# Patient Record
Sex: Female | Born: 1935 | Smoking: Never smoker
Health system: Southern US, Community
[De-identification: ages and names within clinical notes are randomized; demographics above are authoritative.]

## PROBLEM LIST (undated history)

## (undated) DIAGNOSIS — I7090 Unspecified atherosclerosis: Secondary | ICD-10-CM

## (undated) DIAGNOSIS — E119 Type 2 diabetes mellitus without complications: Secondary | ICD-10-CM

## (undated) DIAGNOSIS — I10 Essential (primary) hypertension: Secondary | ICD-10-CM

## (undated) DIAGNOSIS — N390 Urinary tract infection, site not specified: Secondary | ICD-10-CM

## (undated) DIAGNOSIS — F411 Generalized anxiety disorder: Secondary | ICD-10-CM

## (undated) DIAGNOSIS — I878 Other specified disorders of veins: Secondary | ICD-10-CM

## (undated) DIAGNOSIS — G4700 Insomnia, unspecified: Secondary | ICD-10-CM

## (undated) DIAGNOSIS — M1990 Unspecified osteoarthritis, unspecified site: Secondary | ICD-10-CM

## (undated) DIAGNOSIS — E785 Hyperlipidemia, unspecified: Secondary | ICD-10-CM

## (undated) DIAGNOSIS — R739 Hyperglycemia, unspecified: Secondary | ICD-10-CM

## (undated) HISTORY — DX: Hyperlipidemia, unspecified: E78.5

## (undated) HISTORY — DX: Insomnia, unspecified: G47.00

## (undated) HISTORY — DX: Unspecified atherosclerosis: I70.90

## (undated) HISTORY — DX: Generalized anxiety disorder: F41.1

## (undated) HISTORY — DX: Other specified disorders of veins: I87.8

## (undated) HISTORY — PX: BREAST BIOPSY: SHX20

## (undated) HISTORY — DX: Unspecified osteoarthritis, unspecified site: M19.90

## (undated) HISTORY — DX: Hyperglycemia, unspecified: R73.9

## (undated) HISTORY — DX: Urinary tract infection, site not specified: N39.0

---

## 1977-07-13 HISTORY — PX: ABDOMINAL HYSTERECTOMY: SHX81

## 1982-07-13 HISTORY — PX: CHOLECYSTECTOMY: SHX55

## 2005-07-13 HISTORY — PX: REPLACEMENT TOTAL KNEE: SUR1224

## 2009-10-31 DIAGNOSIS — E785 Hyperlipidemia, unspecified: Secondary | ICD-10-CM | POA: Insufficient documentation

## 2010-03-25 DIAGNOSIS — F411 Generalized anxiety disorder: Secondary | ICD-10-CM | POA: Insufficient documentation

## 2010-05-29 DIAGNOSIS — IMO0002 Reserved for concepts with insufficient information to code with codable children: Secondary | ICD-10-CM | POA: Insufficient documentation

## 2015-07-14 LAB — HM COLONOSCOPY

## 2016-09-15 DIAGNOSIS — Z79899 Other long term (current) drug therapy: Secondary | ICD-10-CM | POA: Diagnosis not present

## 2016-09-15 DIAGNOSIS — E785 Hyperlipidemia, unspecified: Secondary | ICD-10-CM | POA: Diagnosis not present

## 2016-09-17 DIAGNOSIS — E785 Hyperlipidemia, unspecified: Secondary | ICD-10-CM | POA: Diagnosis not present

## 2016-09-17 DIAGNOSIS — I2510 Atherosclerotic heart disease of native coronary artery without angina pectoris: Secondary | ICD-10-CM | POA: Diagnosis not present

## 2016-09-17 DIAGNOSIS — I10 Essential (primary) hypertension: Secondary | ICD-10-CM | POA: Diagnosis not present

## 2016-09-17 DIAGNOSIS — I872 Venous insufficiency (chronic) (peripheral): Secondary | ICD-10-CM | POA: Diagnosis not present

## 2016-09-17 DIAGNOSIS — G4733 Obstructive sleep apnea (adult) (pediatric): Secondary | ICD-10-CM | POA: Diagnosis not present

## 2016-09-17 DIAGNOSIS — Z0000 Encounter for general adult medical examination without abnormal findings: Secondary | ICD-10-CM | POA: Diagnosis not present

## 2016-09-17 DIAGNOSIS — E669 Obesity, unspecified: Secondary | ICD-10-CM | POA: Diagnosis not present

## 2016-10-01 DIAGNOSIS — M5137 Other intervertebral disc degeneration, lumbosacral region: Secondary | ICD-10-CM | POA: Diagnosis not present

## 2016-10-01 DIAGNOSIS — M9903 Segmental and somatic dysfunction of lumbar region: Secondary | ICD-10-CM | POA: Diagnosis not present

## 2016-10-01 DIAGNOSIS — M62838 Other muscle spasm: Secondary | ICD-10-CM | POA: Diagnosis not present

## 2016-10-01 DIAGNOSIS — M9902 Segmental and somatic dysfunction of thoracic region: Secondary | ICD-10-CM | POA: Diagnosis not present

## 2016-10-01 DIAGNOSIS — M9905 Segmental and somatic dysfunction of pelvic region: Secondary | ICD-10-CM | POA: Diagnosis not present

## 2016-10-01 DIAGNOSIS — M545 Low back pain: Secondary | ICD-10-CM | POA: Diagnosis not present

## 2016-10-01 DIAGNOSIS — M62830 Muscle spasm of back: Secondary | ICD-10-CM | POA: Diagnosis not present

## 2016-10-01 DIAGNOSIS — M5136 Other intervertebral disc degeneration, lumbar region: Secondary | ICD-10-CM | POA: Diagnosis not present

## 2016-10-01 DIAGNOSIS — M5416 Radiculopathy, lumbar region: Secondary | ICD-10-CM | POA: Diagnosis not present

## 2016-11-19 DIAGNOSIS — M9902 Segmental and somatic dysfunction of thoracic region: Secondary | ICD-10-CM | POA: Diagnosis not present

## 2016-11-19 DIAGNOSIS — M545 Low back pain: Secondary | ICD-10-CM | POA: Diagnosis not present

## 2016-11-19 DIAGNOSIS — M62830 Muscle spasm of back: Secondary | ICD-10-CM | POA: Diagnosis not present

## 2016-11-19 DIAGNOSIS — M5137 Other intervertebral disc degeneration, lumbosacral region: Secondary | ICD-10-CM | POA: Diagnosis not present

## 2016-11-19 DIAGNOSIS — M5416 Radiculopathy, lumbar region: Secondary | ICD-10-CM | POA: Diagnosis not present

## 2016-11-19 DIAGNOSIS — M62838 Other muscle spasm: Secondary | ICD-10-CM | POA: Diagnosis not present

## 2016-11-19 DIAGNOSIS — M9903 Segmental and somatic dysfunction of lumbar region: Secondary | ICD-10-CM | POA: Diagnosis not present

## 2016-11-19 DIAGNOSIS — M9905 Segmental and somatic dysfunction of pelvic region: Secondary | ICD-10-CM | POA: Diagnosis not present

## 2016-11-19 DIAGNOSIS — M5136 Other intervertebral disc degeneration, lumbar region: Secondary | ICD-10-CM | POA: Diagnosis not present

## 2016-12-24 DIAGNOSIS — M5136 Other intervertebral disc degeneration, lumbar region: Secondary | ICD-10-CM | POA: Diagnosis not present

## 2016-12-24 DIAGNOSIS — M9903 Segmental and somatic dysfunction of lumbar region: Secondary | ICD-10-CM | POA: Diagnosis not present

## 2016-12-24 DIAGNOSIS — M9901 Segmental and somatic dysfunction of cervical region: Secondary | ICD-10-CM | POA: Diagnosis not present

## 2016-12-24 DIAGNOSIS — M542 Cervicalgia: Secondary | ICD-10-CM | POA: Diagnosis not present

## 2016-12-24 DIAGNOSIS — M9902 Segmental and somatic dysfunction of thoracic region: Secondary | ICD-10-CM | POA: Diagnosis not present

## 2016-12-24 DIAGNOSIS — M62830 Muscle spasm of back: Secondary | ICD-10-CM | POA: Diagnosis not present

## 2016-12-24 DIAGNOSIS — M5416 Radiculopathy, lumbar region: Secondary | ICD-10-CM | POA: Diagnosis not present

## 2016-12-24 DIAGNOSIS — M545 Low back pain: Secondary | ICD-10-CM | POA: Diagnosis not present

## 2016-12-24 DIAGNOSIS — M5137 Other intervertebral disc degeneration, lumbosacral region: Secondary | ICD-10-CM | POA: Diagnosis not present

## 2016-12-24 DIAGNOSIS — M9905 Segmental and somatic dysfunction of pelvic region: Secondary | ICD-10-CM | POA: Diagnosis not present

## 2016-12-24 DIAGNOSIS — M62838 Other muscle spasm: Secondary | ICD-10-CM | POA: Diagnosis not present

## 2016-12-31 DIAGNOSIS — H532 Diplopia: Secondary | ICD-10-CM | POA: Diagnosis not present

## 2016-12-31 DIAGNOSIS — H25813 Combined forms of age-related cataract, bilateral: Secondary | ICD-10-CM | POA: Diagnosis not present

## 2016-12-31 DIAGNOSIS — H43393 Other vitreous opacities, bilateral: Secondary | ICD-10-CM | POA: Diagnosis not present

## 2016-12-31 DIAGNOSIS — H527 Unspecified disorder of refraction: Secondary | ICD-10-CM | POA: Diagnosis not present

## 2017-04-01 DIAGNOSIS — Z23 Encounter for immunization: Secondary | ICD-10-CM | POA: Diagnosis not present

## 2017-04-22 DIAGNOSIS — Z1231 Encounter for screening mammogram for malignant neoplasm of breast: Secondary | ICD-10-CM | POA: Diagnosis not present

## 2017-05-04 DIAGNOSIS — L570 Actinic keratosis: Secondary | ICD-10-CM | POA: Diagnosis not present

## 2017-05-04 DIAGNOSIS — I10 Essential (primary) hypertension: Secondary | ICD-10-CM | POA: Diagnosis not present

## 2017-05-04 DIAGNOSIS — L820 Inflamed seborrheic keratosis: Secondary | ICD-10-CM | POA: Diagnosis not present

## 2017-05-04 DIAGNOSIS — D1801 Hemangioma of skin and subcutaneous tissue: Secondary | ICD-10-CM | POA: Diagnosis not present

## 2017-05-04 DIAGNOSIS — I2510 Atherosclerotic heart disease of native coronary artery without angina pectoris: Secondary | ICD-10-CM | POA: Diagnosis not present

## 2017-05-04 DIAGNOSIS — L853 Xerosis cutis: Secondary | ICD-10-CM | POA: Diagnosis not present

## 2017-05-04 DIAGNOSIS — L821 Other seborrheic keratosis: Secondary | ICD-10-CM | POA: Diagnosis not present

## 2017-09-22 DIAGNOSIS — M8580 Other specified disorders of bone density and structure, unspecified site: Secondary | ICD-10-CM | POA: Diagnosis not present

## 2017-09-22 DIAGNOSIS — E785 Hyperlipidemia, unspecified: Secondary | ICD-10-CM | POA: Diagnosis not present

## 2017-09-28 DIAGNOSIS — E785 Hyperlipidemia, unspecified: Secondary | ICD-10-CM | POA: Diagnosis not present

## 2017-09-28 DIAGNOSIS — R7301 Impaired fasting glucose: Secondary | ICD-10-CM | POA: Diagnosis not present

## 2017-09-28 DIAGNOSIS — Z0000 Encounter for general adult medical examination without abnormal findings: Secondary | ICD-10-CM | POA: Diagnosis not present

## 2017-09-28 DIAGNOSIS — Z780 Asymptomatic menopausal state: Secondary | ICD-10-CM | POA: Diagnosis not present

## 2017-09-28 DIAGNOSIS — R399 Unspecified symptoms and signs involving the genitourinary system: Secondary | ICD-10-CM | POA: Diagnosis not present

## 2017-09-28 DIAGNOSIS — N3001 Acute cystitis with hematuria: Secondary | ICD-10-CM | POA: Diagnosis not present

## 2017-09-28 DIAGNOSIS — F411 Generalized anxiety disorder: Secondary | ICD-10-CM | POA: Diagnosis not present

## 2017-09-28 DIAGNOSIS — M8580 Other specified disorders of bone density and structure, unspecified site: Secondary | ICD-10-CM | POA: Diagnosis not present

## 2017-09-28 DIAGNOSIS — F5104 Psychophysiologic insomnia: Secondary | ICD-10-CM | POA: Diagnosis not present

## 2017-09-28 DIAGNOSIS — Z23 Encounter for immunization: Secondary | ICD-10-CM | POA: Diagnosis not present

## 2017-09-28 DIAGNOSIS — I2510 Atherosclerotic heart disease of native coronary artery without angina pectoris: Secondary | ICD-10-CM | POA: Diagnosis not present

## 2017-09-28 DIAGNOSIS — I872 Venous insufficiency (chronic) (peripheral): Secondary | ICD-10-CM | POA: Diagnosis not present

## 2017-10-12 DIAGNOSIS — Z780 Asymptomatic menopausal state: Secondary | ICD-10-CM | POA: Diagnosis not present

## 2017-10-12 DIAGNOSIS — M8580 Other specified disorders of bone density and structure, unspecified site: Secondary | ICD-10-CM | POA: Diagnosis not present

## 2017-10-12 DIAGNOSIS — M85852 Other specified disorders of bone density and structure, left thigh: Secondary | ICD-10-CM | POA: Diagnosis not present

## 2017-10-12 DIAGNOSIS — M8588 Other specified disorders of bone density and structure, other site: Secondary | ICD-10-CM | POA: Diagnosis not present

## 2017-10-19 DIAGNOSIS — H838X3 Other specified diseases of inner ear, bilateral: Secondary | ICD-10-CM | POA: Diagnosis not present

## 2017-10-19 DIAGNOSIS — H903 Sensorineural hearing loss, bilateral: Secondary | ICD-10-CM | POA: Diagnosis not present

## 2017-12-20 ENCOUNTER — Encounter: Payer: Self-pay | Admitting: Primary Care

## 2017-12-20 ENCOUNTER — Ambulatory Visit (INDEPENDENT_AMBULATORY_CARE_PROVIDER_SITE_OTHER): Payer: Medicare Other | Admitting: Primary Care

## 2017-12-20 VITALS — BP 124/84 | HR 72 | Temp 98.0°F | Ht <= 58 in | Wt 159.5 lb

## 2017-12-20 DIAGNOSIS — M150 Primary generalized (osteo)arthritis: Secondary | ICD-10-CM

## 2017-12-20 DIAGNOSIS — E119 Type 2 diabetes mellitus without complications: Secondary | ICD-10-CM | POA: Insufficient documentation

## 2017-12-20 DIAGNOSIS — R739 Hyperglycemia, unspecified: Secondary | ICD-10-CM

## 2017-12-20 DIAGNOSIS — R7303 Prediabetes: Secondary | ICD-10-CM | POA: Diagnosis not present

## 2017-12-20 DIAGNOSIS — R102 Pelvic and perineal pain: Secondary | ICD-10-CM

## 2017-12-20 DIAGNOSIS — M8949 Other hypertrophic osteoarthropathy, multiple sites: Secondary | ICD-10-CM | POA: Insufficient documentation

## 2017-12-20 DIAGNOSIS — G4700 Insomnia, unspecified: Secondary | ICD-10-CM | POA: Insufficient documentation

## 2017-12-20 DIAGNOSIS — M159 Polyosteoarthritis, unspecified: Secondary | ICD-10-CM | POA: Insufficient documentation

## 2017-12-20 LAB — POC URINALSYSI DIPSTICK (AUTOMATED)
Bilirubin, UA: NEGATIVE
Blood, UA: NEGATIVE
Glucose, UA: NEGATIVE
Ketones, UA: NEGATIVE
Leukocytes, UA: NEGATIVE
Nitrite, UA: NEGATIVE
Protein, UA: NEGATIVE
Spec Grav, UA: >=1.03 — AB (ref 1.010–1.025)
Urobilinogen, UA: 0.2 E.U./dL
pH, UA: 5.5 (ref 5.0–8.0)

## 2017-12-20 LAB — POCT GLYCOSYLATED HEMOGLOBIN (HGB A1C): Hemoglobin A1C: 5.7 % — AB (ref 4.0–5.6)

## 2017-12-20 MED ORDER — ZOLPIDEM TARTRATE 5 MG PO TABS: 5.0000 mg | ORAL_TABLET | Freq: Every evening | ORAL | 0 refills | Status: DC | PRN

## 2017-12-20 MED ORDER — TRAMADOL HCL 50 MG PO TABS: ORAL_TABLET | ORAL | 0 refills | Status: DC

## 2017-12-20 NOTE — Assessment & Plan Note (Signed)
Managed on Tramadol 50 mg once to twice daily for which she's taken for years. Discussed to use sparingly. Refill sent to pharmacy.

## 2017-12-20 NOTE — Assessment & Plan Note (Signed)
Managed on Ambien 10 mg for which she's taken for years. Will send Ambien 5 mg tablet as she's already taking 1/2 tablet. Refill sent to pharmacy.

## 2017-12-20 NOTE — Progress Notes (Signed)
Subjective:    Patient ID: Alyssa Roy, female    DOB: February 15, 1933, 84 y.o.   MRN: [id number]  HPI  [name] is a 84 year old female who presents today to establish care and discuss the problems mentioned below. Will obtain old records.  1) Osteoarthritis: Located to the lower back, hips, groin, knees. Currently managed on Tramadol 50 mg for which she's been taking for several years. Most of the time she'll take at least one tablet daily, sometimes twice daily. She doesn't take anything OTC for her symptoms. 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   2) Insomnia: Currently managed on Ambien 10 mg for which she cuts in half and takes 3-4 times weekly on average. She's tried OTC sleep without improvement. She's been taking this for years and has done well.  It appears she was initiated on Lexapro 5 mg during her last PCP visit in March 2019. She didn't take this for long as it was "temporary".   3) Decrease in Bone Density: Underwent bone density testing recently and was told that she has a decrease in bone density but is unsure if she has osteoporosis or osteopenia. It was recommended she start Fosamax but she declined. She does not take calcium and vitamin D.  4) Suprapubic Pressure: History of UTI that was last diagnosed around March/April 2019. She was treated with Augmentin for which she could not tolerate, then Cipro. She doesn't ever believe her symptoms resolved. She denies hematuria, frequency, dysuria, fevers, nausea.   Review of Systems  Constitutional: Negative for unexpected weight change.  Eyes: Negative for visual disturbance.  Respiratory: Negative for shortness of breath.   Cardiovascular: Negative for chest pain.  Genitourinary: Negative for dysuria, flank pain, frequency, hematuria and vaginal discharge.       Suprapubic pressure.  Musculoskeletal: Positive for arthralgias.  Skin: Negative for color change.  Neurological:  Negative for headaches.  Hematological: Negative for adenopathy.  Psychiatric/Behavioral:       Chronic insomnia       Past Medical History:  Diagnosis Date  . Atherosclerosis   . Chronic venous stasis   . GAD (generalized anxiety disorder)   . Hyperglycemia   . Hyperlipidemia   . Insomnia   . Osteoarthritis   . Urinary tract infection      Social History   Socioeconomic History  . Marital status: Unknown    Spouse name: Not on file  . Number of children: Not on file  . Years of education: Not on file  . Highest education level: Not on file  Occupational History  . Not on file  Social Needs  . Financial resource strain: Not on file  . Food insecurity:    Worry: Not on file    Inability: Not on file  . Transportation needs:    Medical: Not on file    Non-medical: Not on file  Tobacco Use  . Smoking status: Never Smoker  . Smokeless tobacco: Never Used  Substance and Sexual Activity  . Alcohol use: Never    Frequency: Never  . Drug use: Not on file  . Sexual activity: Not on file  Lifestyle  . Physical activity:    Days per week: Not on file    Minutes per session: Not on file  . Stress: Not on file  Relationships  . Social connections:    Talks on phone: Not on file    Gets together: Not on file  Attends religious service: Not on file    Active member of club or organization: Not on file    Attends meetings of clubs or organizations: Not on file    Relationship status: Not on file  . Intimate partner violence:    Fear of current or ex partner: Not on file    Emotionally abused: Not on file    Physically abused: Not on file    Forced sexual activity: Not on file  Other Topics Concern  . Not on file  Social History Narrative  . Not on file      Family History  Problem Relation Age of Onset  . Heart disease Mother   . Arthritis Mother   . Diabetes Father     Allergies  Allergen Reactions  . Crestor [Rosuvastatin Calcium]   . Feldene  [Piroxicam]   . Keflex [Cephalexin]   . Lipitor [Atorvastatin Calcium]   . Zetia [Ezetimibe]     No current outpatient medications on file prior to visit.   No current facility-administered medications on file prior to visit.     BP 124/84   Pulse 72   Temp 98 F (36.7 C) (Oral)   Ht 4' 7.25" (1.403 m)   Wt 159 lb 8 oz (72.3 kg)   SpO2 98%   BMI 36.74 kg/m    Objective:   Physical Exam  Constitutional: She is oriented to person, place, and time. She appears well-nourished.  Neck: Neck supple.  Cardiovascular: Normal rate and regular rhythm.  Respiratory: Effort normal and breath sounds normal.  Musculoskeletal:  Decrease in ROM to lumbar spine  Neurological: She is alert and oriented to person, place, and time.  Skin: Skin is warm and dry.  Psychiatric: She has a normal mood and affect.           Assessment & Plan:  Suprapubic Pressure:  History of UTI several months ago.  UA today negative.  Could be interstitial cystitis. She will report if symptoms persist. Didn't have much time today to do further work up.  Pleas Koch, NP

## 2017-12-20 NOTE — Patient Instructions (Signed)
Use the Tramadol sparingly as discussed, I sent a refill to your pharmacy.  I sent Ambien 5 mg tablets to your pharmacy.  Stop by the lab prior to leaving today. I will notify you of your results once received.   Start taking Calcium and Vitamin D. You need 1200 mg of calcium and 800 units of vitamin D daily.  It was a pleasure to meet you today! Please don't hesitate to call or message me with any questions. Welcome to [facility]!

## 2017-12-20 NOTE — Assessment & Plan Note (Signed)
Noted on recent labs, POC A1C of 5.7 today.  Will continue to monitor.

## 2018-01-26 DIAGNOSIS — H25813 Combined forms of age-related cataract, bilateral: Secondary | ICD-10-CM | POA: Diagnosis not present

## 2018-01-26 DIAGNOSIS — H532 Diplopia: Secondary | ICD-10-CM | POA: Diagnosis not present

## 2018-01-26 DIAGNOSIS — H43393 Other vitreous opacities, bilateral: Secondary | ICD-10-CM | POA: Diagnosis not present

## 2018-01-26 DIAGNOSIS — H524 Presbyopia: Secondary | ICD-10-CM | POA: Diagnosis not present

## 2018-04-12 DIAGNOSIS — Z23 Encounter for immunization: Secondary | ICD-10-CM | POA: Diagnosis not present

## 2018-04-21 ENCOUNTER — Other Ambulatory Visit: Payer: Self-pay | Admitting: Primary Care

## 2018-04-21 DIAGNOSIS — M150 Primary generalized (osteo)arthritis: Principal | ICD-10-CM

## 2018-04-21 DIAGNOSIS — M8949 Other hypertrophic osteoarthropathy, multiple sites: Secondary | ICD-10-CM

## 2018-04-21 DIAGNOSIS — M159 Polyosteoarthritis, unspecified: Secondary | ICD-10-CM

## 2018-04-21 NOTE — Telephone Encounter (Signed)
Name of Medication: traMADol (ULTRAM) 50 MG tablet  Name of Pharmacy: [facility]  Last Fill or Written Date and Quantity: 12/21/2014  #60  Last Office Visit and Type: 12/20/2017 [facility]  Next Office Visit and Type:   Last Controlled Substance Agreement Date:   Last UDS:

## 2018-04-21 NOTE — Telephone Encounter (Signed)
Noted, refill sent to pharmacy. No suspicious activity noted on PMP aware site.  

## 2018-04-23 ENCOUNTER — Emergency Department: Payer: Medicare Other

## 2018-04-23 ENCOUNTER — Emergency Department
Admission: EM | Admit: 2018-04-23 | Discharge: 2018-04-23 | Disposition: A | Discharge status: Home / Self Care | Payer: Medicare Other | Attending: Emergency Medicine | Admitting: Emergency Medicine

## 2018-04-23 ENCOUNTER — Other Ambulatory Visit: Payer: Self-pay

## 2018-04-23 ENCOUNTER — Encounter: Payer: Self-pay | Admitting: Emergency Medicine

## 2018-04-23 DIAGNOSIS — R7303 Prediabetes: Secondary | ICD-10-CM | POA: Insufficient documentation

## 2018-04-23 DIAGNOSIS — M542 Cervicalgia: Secondary | ICD-10-CM | POA: Diagnosis not present

## 2018-04-23 DIAGNOSIS — I10 Essential (primary) hypertension: Secondary | ICD-10-CM | POA: Insufficient documentation

## 2018-04-23 DIAGNOSIS — Z96651 Presence of right artificial knee joint: Secondary | ICD-10-CM | POA: Insufficient documentation

## 2018-04-23 DIAGNOSIS — M19032 Primary osteoarthritis, left wrist: Secondary | ICD-10-CM | POA: Diagnosis not present

## 2018-04-23 DIAGNOSIS — M25532 Pain in left wrist: Secondary | ICD-10-CM | POA: Diagnosis present

## 2018-04-23 DIAGNOSIS — L03114 Cellulitis of left upper limb: Secondary | ICD-10-CM | POA: Diagnosis not present

## 2018-04-23 DIAGNOSIS — M7989 Other specified soft tissue disorders: Secondary | ICD-10-CM | POA: Diagnosis not present

## 2018-04-23 LAB — BASIC METABOLIC PANEL
Anion gap: 10 (ref 5–15)
BUN: 14 mg/dL (ref 8–23)
CO2: 25 mmol/L (ref 22–32)
Calcium: 9.2 mg/dL (ref 8.9–10.3)
Chloride: 105 mmol/L (ref 98–111)
Creatinine, Ser: 0.73 mg/dL (ref 0.44–1.00)
GFR calc Af Amer: >60 mL/min (ref >60)
GFR calc non Af Amer: >60 mL/min (ref >60)
Glucose, Bld: 145 mg/dL — ABNORMAL HIGH (ref 70–99)
Potassium: 3.2 mmol/L — ABNORMAL LOW (ref 3.5–5.1)
Sodium: 140 mmol/L (ref 135–145)

## 2018-04-23 LAB — CBC WITH DIFFERENTIAL/PLATELET
Abs Immature Granulocytes: 0.07 10*3/uL (ref 0.00–0.07)
Basophils Absolute: 0 10*3/uL (ref 0.0–0.1)
Basophils Relative: 0 %
Eosinophils Absolute: 0 10*3/uL (ref 0.0–0.5)
Eosinophils Relative: 0 %
HCT: 44.1 % (ref 36.0–46.0)
Hemoglobin: 14.5 g/dL (ref 12.0–15.0)
Immature Granulocytes: 1 %
Lymphocytes Relative: 8 %
Lymphs Abs: 1.1 10*3/uL (ref 0.7–4.0)
MCH: 27.6 pg (ref 26.0–34.0)
MCHC: 32.9 g/dL (ref 30.0–36.0)
MCV: 83.8 fL (ref 80.0–100.0)
Monocytes Absolute: 0.9 10*3/uL (ref 0.1–1.0)
Monocytes Relative: 7 %
Neutro Abs: 11.1 10*3/uL — ABNORMAL HIGH (ref 1.7–7.7)
Neutrophils Relative %: 84 %
Platelets: 295 10*3/uL (ref 150–400)
RBC: 5.26 MIL/uL — ABNORMAL HIGH (ref 3.87–5.11)
RDW: 14.2 % (ref 11.5–15.5)
WBC: 13.2 10*3/uL — ABNORMAL HIGH (ref 4.0–10.5)
nRBC: 0 % (ref 0.0–0.2)

## 2018-04-23 LAB — URINALYSIS, COMPLETE (UACMP) WITH MICROSCOPIC
Bacteria, UA: NONE SEEN
Bilirubin Urine: NEGATIVE
Glucose, UA: NEGATIVE mg/dL
Ketones, ur: 20 mg/dL — AB
Leukocytes, UA: NEGATIVE
Nitrite: NEGATIVE
Protein, ur: 100 mg/dL — AB
Specific Gravity, Urine: 1.024 (ref 1.005–1.030)
pH: 5 (ref 5.0–8.0)

## 2018-04-23 LAB — URIC ACID: Uric Acid, Serum: 4.3 mg/dL (ref 2.5–7.1)

## 2018-04-23 MED ORDER — KETOROLAC TROMETHAMINE 30 MG/ML IJ SOLN
30.0000 mg | Freq: Once | INTRAMUSCULAR | Status: AC
  Administered 2018-04-23: 30 mg via INTRAVENOUS
  Filled 2018-04-23: qty 1

## 2018-04-23 MED ORDER — LORAZEPAM 2 MG/ML IJ SOLN
1.0000 mg | Freq: Once | INTRAMUSCULAR | Status: AC
  Administered 2018-04-23: 1 mg via INTRAVENOUS
  Filled 2018-04-23: qty 1

## 2018-04-23 MED ORDER — KEFLEX 500 MG PO CAPS: 500.0000 mg | ORAL_CAPSULE | Freq: Three times a day (TID) | ORAL | 0 refills | Status: DC

## 2018-04-23 MED ORDER — CYCLOBENZAPRINE HCL 5 MG PO TABS: 5.0000 mg | ORAL_TABLET | Freq: Three times a day (TID) | ORAL | 0 refills | Status: AC | PRN

## 2018-04-23 MED ORDER — CEFAZOLIN SODIUM-DEXTROSE 1-4 GM/50ML-% IV SOLN
1.0000 g | Freq: Once | INTRAVENOUS | Status: AC
  Administered 2018-04-23: 1 g via INTRAVENOUS
  Filled 2018-04-23: qty 50

## 2018-04-23 NOTE — ED Triage Notes (Signed)
Pt to ED from home c/o left wrist pain x2 days.  States noticed swelling and redness that has gotten worse that is a throbbing pain.  Denies injury or falls.  Also c/o left neck pain worse with turning head.  Denies chest pain or SOB, pt A&Ox4, chest rise even and unlabored.  States has not taken BP medication for a couple years.

## 2018-04-23 NOTE — Discharge Instructions (Addendum)
You have been treated for a (non-purulent) cellulitis. Take the antibiotic as directed. Take the muscle relaxant as needed. You may dose these medicines along with your "as needed" Tramadol and Tylenol. Follow-up with your provider for wound check and blood pressure check, next week. Return to the ED as needed.

## 2018-04-23 NOTE — ED Provider Notes (Signed)
[facility] Emergency Department Provider Note ____________________________________________  Time seen: 1603  I have reviewed the triage vital signs and the nursing notes.  HISTORY  Chief Complaint  Wrist Pain  HPI Alyssa Roy is a 84 y.o. female presented to the ED accompanied by her son and daughter-in-law, for evaluation of pain, redness, swelling to the left wrist.  Patient with a history of atherosclerosis, chronic venous stasis, and osteoarthritis, presents with pain to the left wrist dorsally without known injury or trauma.  She does recall there was a small blisterlike formation to the thenar prominence of the palm a few days prior to onset.  She describes the area was itchy and she does admit to scratching it.  Since that time she has had some redness, swelling to the dorsal hand and wrist.  She was concerned because she began to see sprain of the redness up towards the elbow.  She denies any outright fevers, chills, sweats, chest pain, or shortness of breath.  She is been taken Tylenol intermittently as she does daily for arthritis pain relief.  She took her tramadol as directed every 8 hours for pain, but reports that she did not have any benefit from the tramadol of each of the wrist pain.  Nuys any history of gouty arthritis, cellulitis, or eczema.  Past Medical History:  Diagnosis Date  . Atherosclerosis   . Chronic venous stasis   . GAD (generalized anxiety disorder)   . Hyperglycemia   . Hyperlipidemia   . Insomnia   . Osteoarthritis   . Urinary tract infection     Patient Active Problem List   Diagnosis Date Noted  . Insomnia 12/20/2017  . Primary osteoarthritis involving multiple joints 12/20/2017  . Prediabetes 12/20/2017    Past Surgical History:  Procedure Laterality Date  . ABDOMINAL HYSTERECTOMY  1979  . CHOLECYSTECTOMY  1984  . REPLACEMENT TOTAL KNEE Right 2007    Prior to Admission medications   Medication Sig Start Date  End Date Taking? Authorizing Provider  cyclobenzaprine (FLEXERIL) 5 MG tablet Take 1 tablet (5 mg total) by mouth 3 (three) times daily as needed for up to 3 days for muscle spasms. 04/23/18 04/26/18  Camay Pedigo, Dannielle Karvonen, PA-C  KEFLEX 500 MG capsule Take 1 capsule (500 mg total) by mouth 3 (three) times daily for 10 days. 04/23/18 05/03/18  Artemisia Auvil, Dannielle Karvonen, PA-C  traMADol (ULTRAM) 50 MG tablet TAKE 1 TABLET BY MOUTH EVERY 8 HOURS AS NEEDED FOR PAIN. USE SPARINGLY. 04/21/18   Pleas Koch, NP  zolpidem (AMBIEN) 5 MG tablet Take 1 tablet (5 mg total) by mouth at bedtime as needed for sleep. 12/20/17   Pleas Koch, NP    Allergies Crestor [rosuvastatin calcium]; Feldene [piroxicam]; Keflex [cephalexin]; Lipitor [atorvastatin calcium]; and Zetia [ezetimibe]  Family History  Problem Relation Age of Onset  . Heart disease Mother   . Arthritis Mother   . Diabetes Father     Social History Social History   Tobacco Use  . Smoking status: Never Smoker  . Smokeless tobacco: Never Used  Substance Use Topics  . Alcohol use: Never    Frequency: Never  . Drug use: Not on file    Review of Systems  Constitutional: Negative for fever. Eyes: Negative for visual changes. ENT: Negative for sore throat. Cardiovascular: Negative for chest pain. Respiratory: Negative for shortness of breath. Gastrointestinal: Negative for abdominal pain, vomiting and diarrhea. Genitourinary: Negative for dysuria. Musculoskeletal: Positive for left neck  pain. Left wrist pain and swelling as noted.  Skin: Negative for rash. Left wrist redness as above Neurological: Negative for headaches, focal weakness or numbness. ____________________________________________  PHYSICAL EXAM:  VITAL SIGNS: ED Triage Vitals  Enc Vitals Group     BP 04/23/18 1204 (!) 180/114     Pulse Rate 04/23/18 1204 99     Resp 04/23/18 1204 12     Temp 04/23/18 1204 98.3 F (36.8 C)     Temp Source 04/23/18 1204  Oral     SpO2 04/23/18 1204 96 %     Weight 04/23/18 1211 165 lb (74.8 kg)     Height 04/23/18 1211 4\' 7"  (1.397 m)     Head Circumference --      Peak Flow --      Pain Score 04/23/18 1210 9     Pain Loc --      Pain Edu? --      Excl. in [place]? --     Constitutional: Alert and oriented. Well appearing and in no distress. Head: Normocephalic and atraumatic. Eyes: Conjunctivae are normal. Normal extraocular movements Cardiovascular: Normal rate, regular rhythm. Normal distal pulses. Respiratory: Normal respiratory effort. No wheezes/rales/rhonchi. Musculoskeletal: Left hand and wrist with obvious soft tissue swelling and subtle erythema noted dorsally.  She also appears to be some lymphangitis up the dorsal aspect of the forearm.  Patient is exquisitely tender to palpation over the dorsal wrist.  She has decreased range of motion with wrist extension, composite fist, and supination range.  The elbow is without tenderness to palpation in the shoulder ranges normally.  Also has some palpable tenderness over the left upper trapezius musculature.  She otherwise is with some decreased left rotation and left lateral bending to the neck.  Nontender with normal range of motion in all other extremities.  Neurologic:  Normal sensation. Normal speech and language. No gross focal neurologic deficits are appreciated. Skin:  Skin is warm, dry and intact. No rash noted. Psychiatric: Mood and affect are normal. Patient exhibits appropriate insight and judgment. ____________________________________________   LABS (pertinent positives/negatives)  Labs Reviewed  CBC WITH DIFFERENTIAL/PLATELET - Abnormal; Notable for the following components:      Result Value   WBC 13.2 (*)    RBC 5.26 (*)    Neutro Abs 11.1 (*)    All other components within normal limits  BASIC METABOLIC PANEL - Abnormal; Notable for the following components:   Potassium 3.2 (*)    Glucose, Bld 145 (*)    All other components within  normal limits  URINALYSIS, COMPLETE (UACMP) WITH MICROSCOPIC - Abnormal; Notable for the following components:   Color, Urine YELLOW (*)    APPearance CLEAR (*)    Hgb urine dipstick MODERATE (*)    Ketones, ur 20 (*)    Protein, ur 100 (*)    All other components within normal limits  URIC ACID  ____________________________________________   RADIOLOGY  Left Wrist IMPRESSION: Soft tissue swelling.  Negative for fracture ____________________________________________  PROCEDURES  Procedures Toradol 30 mg IVP Lorazepam 1 mg IVP Cefazolin 50 mg IVPB ____________________________________________  INITIAL IMPRESSION / ASSESSMENT AND PLAN / ED COURSE  Geriatric patient with ED evaluation of sudden left wrist pain, swelling, and redness; without recent trauma. The DDX includes cellulitis, gout, septic joint, and fracture. She is found to have a negative x-ray and a normal uric acid.  She has an elevated white count at 13+ and a left shift noted.  Patient was treated  empirically for a nonpurulent cellulitis of the left forearm.  IV antibiotics are provided, and the time of discharge patient has improved swelling, and redness of the dorsal wrist.  Skin marker is used to locate the current edges of the erythema.  Patient will monitor treatment and will be discharged with a prescription for Keflex to be dosed as directed.  She is also given a small prescription of cyclobenzaprine for muscle pain relief.  She may use her over-the-counter tramadol as needed for additional pain relief.  She is encouraged to follow with primary provider in 1 week for wound check as necessary.  Return precautions have been reviewed.  Patient, her adult son, and his wife, all verbalized understanding of instructions and questions were encouraged and answered. ____________________________________________  FINAL CLINICAL IMPRESSION(S) / ED DIAGNOSES  Final diagnoses:  Cellulitis of left upper extremity  Uncontrolled  hypertension      Trevel Dillenbeck, Dannielle Karvonen, PA-C 04/24/18 0040    Schuyler Amor, MD 04/24/18 2113

## 2018-04-23 NOTE — ED Notes (Signed)
Pt has redness pain and swelling to l wrist distal forearm  Pt denies injury  bp is   elevated pt has a history of htn in past not on any bp meds at this time   She is awake and alert oriented -family  At bedside

## 2018-04-25 ENCOUNTER — Telehealth: Payer: Self-pay | Admitting: Primary Care

## 2018-04-25 NOTE — Telephone Encounter (Signed)
Spoken and notified patient's daughter in law of Tawni Millers comments. Verbalized understanding

## 2018-04-25 NOTE — Telephone Encounter (Signed)
Yes, Ice three times daily with a towel for 20 minute intervals. Also anti-inflammatories like ibuprofen, naproxen, Advil, Motrin, Aleve.

## 2018-04-25 NOTE — Telephone Encounter (Signed)
Alyssa Roy aware of appointment

## 2018-04-25 NOTE — Telephone Encounter (Signed)
Hand is still swollen should they be putting ice or heat pad on it

## 2018-04-25 NOTE — Telephone Encounter (Signed)
I can add her on at 4 pm on Tuesday October 15th, please schedule. Thanks!

## 2018-04-25 NOTE — Telephone Encounter (Signed)
See below CRM  Copied from [place] [phone]. Topic: Appointment Scheduling - Scheduling Inquiry for Clinic >> Apr 25, 2018  7:33 AM Scherrie Gerlach wrote: Reason for CRM: pt went to the ED this weekend. Dx with cellulitis.  Daughter in law calling to get a hosp follow up appt asap, because she states pt is not better and in pain. Pt also instructed to see her dr for bp issues. Anda Kraft is completely booked this week, and she wants to be seen asap.  Please advise >> Apr 25, 2018  8:25 AM Modena Nunnery, CMA wrote: Pt is not TCM eligible

## 2018-04-26 ENCOUNTER — Ambulatory Visit (INDEPENDENT_AMBULATORY_CARE_PROVIDER_SITE_OTHER): Payer: Medicare Other | Admitting: Primary Care

## 2018-04-26 VITALS — BP 134/74 | HR 71 | Temp 98.0°F | Ht <= 58 in | Wt 169.5 lb

## 2018-04-26 DIAGNOSIS — M150 Primary generalized (osteo)arthritis: Secondary | ICD-10-CM | POA: Diagnosis not present

## 2018-04-26 DIAGNOSIS — M8949 Other hypertrophic osteoarthropathy, multiple sites: Secondary | ICD-10-CM

## 2018-04-26 DIAGNOSIS — L03114 Cellulitis of left upper limb: Secondary | ICD-10-CM | POA: Diagnosis not present

## 2018-04-26 DIAGNOSIS — M159 Polyosteoarthritis, unspecified: Secondary | ICD-10-CM

## 2018-04-26 MED ORDER — DICLOFENAC SODIUM 75 MG PO TBEC: 75.0000 mg | DELAYED_RELEASE_TABLET | Freq: Two times a day (BID) | ORAL | 0 refills | Status: DC | PRN

## 2018-04-26 NOTE — Assessment & Plan Note (Signed)
Increased to neck and hips recently.   Will have her start with scheduled Tylenol Arthritis at 1300 mg BID or 650 mg q 6-8 hours. Rx for diclofenac EC tablets sent to pharmacy to take as needed for breakthrough pain. Discussed to avoid Tramadol if no improvement.   She will update.

## 2018-04-26 NOTE — Patient Instructions (Signed)
Start taking your Tylenol Arthritis daily. Take two 650 mg tablets twice daily or one 650 mg tablet every 6 hours. Do this for at least 2 weeks.   You may take the diclofenac medication for inflammation and pain twice daily as needed.  Elevate your arm as discussed to reduce swelling. Ice your hand three times daily for 20 minutes at time.   Schedule a follow up visit for Monday next week. It was a pleasure to see you today!

## 2018-04-26 NOTE — Progress Notes (Signed)
Subjective:    Patient ID: Alyssa Roy, female    DOB: 14-May-1933, 84 y.o.   MRN: [id number]  HPI  Alyssa Roy is an 84 year old female who presents today for emergency department follow up.  She presented to [facility] ED on 04/23/18 with reports of left dorsal wrist pain with swelling and redness. She noticed a blister to the hand several days prior, did scratch the site.   During her stay in the ED she underwent work up including uric acid (negative), CBC (WBC count of 13 with left shift), xray of the hand which was unremarkable. Given presentation with leukocytosis, she was presumed to have non purulent cellulitis and was treated with IV Cefazolin 50 mg. Her swelling and redness improved and she was discharged home with a prescription for cephalexin 500 mg TID x 10 days and for PCP follow up.   Since her discharge home she has noticed improvement in swelling, which has nearly resolved to her forearm, and is improving to her left wrist and dorsal hand. She is wrapping her arm and wrist with an ACE bandage.  She denies fevers, chills.   BP Readings from Last 3 Encounters:  04/26/18 134/74  04/23/18 (!) 148/87  12/20/17 124/84   She has a history of chronic neck pain, also with chronic joint aches to knees, back hips, feet. She's noticed increased pain to her neck as she's been more sedentary. She was provided with a prescription for cyclobenzaprine during her recent ED visit, this hasn't helped with pain. She was seeing a chiropractor every several months for neck adjustments, this helped with her pain but she hasn't been back in 6 months.   Most days she's taking Tramadol 50 mg sparingly as needed, sometimes 1200 mg of Tylenol. She's not taking anything regularly for arthritis pain.    Review of Systems  Constitutional: Negative for fever.  Musculoskeletal: Positive for arthralgias, back pain and neck pain.  Skin: Positive for color change.       Past Medical History:  Diagnosis  Date  . Atherosclerosis   . Chronic venous stasis   . GAD (generalized anxiety disorder)   . Hyperglycemia   . Hyperlipidemia   . Insomnia   . Osteoarthritis   . Urinary tract infection      Social History   Socioeconomic History  . Marital status: Unknown    Spouse name: Not on file  . Number of children: Not on file  . Years of education: Not on file  . Highest education level: Not on file  Occupational History  . Not on file  Social Needs  . Financial resource strain: Not on file  . Food insecurity:    Worry: Not on file    Inability: Not on file  . Transportation needs:    Medical: Not on file    Non-medical: Not on file  Tobacco Use  . Smoking status: Never Smoker  . Smokeless tobacco: Never Used  Substance and Sexual Activity  . Alcohol use: Never    Frequency: Never  . Drug use: Not on file  . Sexual activity: Not on file  Lifestyle  . Physical activity:    Days per week: Not on file    Minutes per session: Not on file  . Stress: Not on file  Relationships  . Social connections:    Talks on phone: Not on file    Gets together: Not on file    Attends religious service: Not on file  Active member of club or organization: Not on file    Attends meetings of clubs or organizations: Not on file    Relationship status: Not on file  . Intimate partner violence:    Fear of current or ex partner: Not on file    Emotionally abused: Not on file    Physically abused: Not on file    Forced sexual activity: Not on file  Other Topics Concern  . Not on file  Social History Narrative   Widow.   Retired.   Moved to [place] to live closer to daughter.    Past Surgical History:  Procedure Laterality Date  . ABDOMINAL HYSTERECTOMY  1979  . CHOLECYSTECTOMY  1984  . REPLACEMENT TOTAL KNEE Right 2007    Family History  Problem Relation Age of Onset  . Heart disease Mother   . Arthritis Mother   . Diabetes Father     Allergies  Allergen Reactions  .  Crestor [Rosuvastatin Calcium]   . Feldene [Piroxicam]   . Keflex [Cephalexin]   . Lipitor [Atorvastatin Calcium]   . Zetia [Ezetimibe]     Current Outpatient Medications on File Prior to Visit  Medication Sig Dispense Refill  . cyclobenzaprine (FLEXERIL) 5 MG tablet Take 1 tablet (5 mg total) by mouth 3 (three) times daily as needed for up to 3 days for muscle spasms. 9 tablet 0  . KEFLEX 500 MG capsule Take 1 capsule (500 mg total) by mouth 3 (three) times daily for 10 days. 30 capsule 0  . traMADol (ULTRAM) 50 MG tablet TAKE 1 TABLET BY MOUTH EVERY 8 HOURS AS NEEDED FOR PAIN. USE SPARINGLY. 60 tablet 0  . zolpidem (AMBIEN) 5 MG tablet Take 1 tablet (5 mg total) by mouth at bedtime as needed for sleep. 90 tablet 0   No current facility-administered medications on file prior to visit.     BP 134/74   Pulse 71   Temp 98 F (36.7 C) (Oral)   Ht 4' 7.25" (1.403 m)   Wt 169 lb 8 oz (76.9 kg)   SpO2 95%   BMI 39.04 kg/m    Objective:   Physical Exam  Constitutional: She appears well-nourished.  Neck: Decreased range of motion present.  Decrease in ROM due to pain with flexion, extension, lateral rotation.   Cardiovascular: Normal rate and regular rhythm.  Respiratory: Effort normal.  Skin: Skin is warm and dry. There is erythema.  Moderate swelling to left dorsal hand with mild erythema. No erythema or swelling to posterior forearm (anatomical position).            Assessment & Plan:  Cellulitis:  Located to left dorsal hand and posterior forearm, diagnosed three days ago. Exam today overall appears improved based off of patient reports and HPI from ED visit. She does have the arm wrapped with an ace bandage that seems to be pushing fluid to the dorsal hand. Will have her elevate the extremity without the wrap, ice as well. Continue cephalexin as prescribed.  Follow up early next week for re-evaluation. Hospital notes, labs, imaging reviewed.   Pleas Koch,  NP

## 2018-05-02 ENCOUNTER — Ambulatory Visit (INDEPENDENT_AMBULATORY_CARE_PROVIDER_SITE_OTHER): Payer: Medicare Other | Admitting: Primary Care

## 2018-05-02 ENCOUNTER — Encounter: Payer: Self-pay | Admitting: *Deleted

## 2018-05-02 VITALS — BP 180/96 | HR 63 | Temp 98.3°F | Ht <= 58 in | Wt 168.2 lb

## 2018-05-02 DIAGNOSIS — I10 Essential (primary) hypertension: Secondary | ICD-10-CM | POA: Diagnosis not present

## 2018-05-02 DIAGNOSIS — M150 Primary generalized (osteo)arthritis: Secondary | ICD-10-CM | POA: Diagnosis not present

## 2018-05-02 DIAGNOSIS — M2540 Effusion, unspecified joint: Secondary | ICD-10-CM

## 2018-05-02 DIAGNOSIS — M8949 Other hypertrophic osteoarthropathy, multiple sites: Secondary | ICD-10-CM

## 2018-05-02 DIAGNOSIS — M159 Polyosteoarthritis, unspecified: Secondary | ICD-10-CM

## 2018-05-02 MED ORDER — HYDROCHLOROTHIAZIDE 12.5 MG PO TABS: ORAL_TABLET | ORAL | 3 refills | Status: DC

## 2018-05-02 NOTE — Progress Notes (Signed)
Subjective:    Patient ID: Alyssa Roy, female    DOB: [birth year], 84 y.o.   MRN: [id number]  HPI   Alyssa Roy is an 84 year old female who presents today for follow up of cellulitis. She's also reporting right hand pain with swelling, left lower extremity (ankle edema), and elevated blood pressure readings. She's also requesting a prescription for a wheelchair and a home health aid.  She was last evaluated one week ago for ED follow up due to left hand cellulitis. Her symptoms had improved but she continued to experience swelling with redness to the dorsal hand. Today her symptoms have resolved. She's been compliant to her cephalexin and will complete the prescription this evening.   She's mostly concerned with generalized arthritis pain to her right hand, left shoulder, and left knee. This began 3-4 days ago with swelling and decrease in ROM to those sites. She has been taking the diclofenac over the last 2-3 days with improvement. Her right hand was initially swollen but this has since dissipated.   She is not checking her BP at home as her cuff has been malfunctioning. She's noticed left ankle and lower leg edema which has been intermittent for years. She does try to elevate her legs. She thinks she was once managed on HCTZ for BP and edema but stopped taking medication several years ago due to improvement in BP from lifestyle changes.   BP Readings from Last 3 Encounters:  05/02/18 (!) 180/96  04/26/18 134/74  04/23/18 (!) 148/87   She is requesting assistance for bathing, dressing, meal preparation on an as needed basis when she's sick. She spoke with an "agent" through her insurance today who stated she needed a prescription from her doctor's office. She is also requesting a companion wheelchair in the home to use as needed. She mostly uses her walker and does well.   Review of Systems  Constitutional: Negative for fever.  Respiratory: Negative for shortness of breath.     Cardiovascular: Positive for leg swelling. Negative for chest pain.  Musculoskeletal: Positive for arthralgias and joint swelling.  Skin: Negative for color change.       Past Medical History:  Diagnosis Date  . Atherosclerosis   . Chronic venous stasis   . GAD (generalized anxiety disorder)   . Hyperglycemia   . Hyperlipidemia   . Insomnia   . Osteoarthritis   . Urinary tract infection      Social History   Socioeconomic History  . Marital status: Unknown    Spouse name: Not on file  . Number of children: Not on file  . Years of education: Not on file  . Highest education level: Not on file  Occupational History  . Not on file  Social Needs  . Financial resource strain: Not on file  . Food insecurity:    Worry: Not on file    Inability: Not on file  . Transportation needs:    Medical: Not on file    Non-medical: Not on file  Tobacco Use  . Smoking status: Never Smoker  . Smokeless tobacco: Never Used  Substance and Sexual Activity  . Alcohol use: Never    Frequency: Never  . Drug use: Not on file  . Sexual activity: Not on file  Lifestyle  . Physical activity:    Days per week: Not on file    Minutes per session: Not on file  . Stress: Not on file  Relationships  . Social connections:  Talks on phone: Not on file    Gets together: Not on file    Attends religious service: Not on file    Active member of club or organization: Not on file    Attends meetings of clubs or organizations: Not on file    Relationship status: Not on file  . Intimate partner violence:    Fear of current or ex partner: Not on file    Emotionally abused: Not on file    Physically abused: Not on file    Forced sexual activity: Not on file  Other Topics Concern  . Not on file  Social History Narrative   Widow.   Retired.   Moved to [employer] [place] to live closer to daughter.    Past Surgical History:  Procedure Laterality Date  . ABDOMINAL HYSTERECTOMY  1979  .  CHOLECYSTECTOMY  1984  . REPLACEMENT TOTAL KNEE Right 2007    Family History  Problem Relation Age of Onset  . Heart disease Mother   . Arthritis Mother   . Diabetes Father     Allergies  Allergen Reactions  . Crestor [Rosuvastatin Calcium]   . Feldene [Piroxicam]   . Keflex [Cephalexin]   . Lipitor [Atorvastatin Calcium]   . Zetia [Ezetimibe]     Current Outpatient Medications on File Prior to Visit  Medication Sig Dispense Refill  . diclofenac (VOLTAREN) 75 MG EC tablet Take 1 tablet (75 mg total) by mouth 2 (two) times daily as needed (for arthritis pain.). 60 tablet 0  . KEFLEX 500 MG capsule Take 1 capsule (500 mg total) by mouth 3 (three) times daily for 10 days. 30 capsule 0  . traMADol (ULTRAM) 50 MG tablet TAKE 1 TABLET BY MOUTH EVERY 8 HOURS AS NEEDED FOR PAIN. USE SPARINGLY. 60 tablet 0  . zolpidem (AMBIEN) 5 MG tablet Take 1 tablet (5 mg total) by mouth at bedtime as needed for sleep. 90 tablet 0   No current facility-administered medications on file prior to visit.     BP (!) 180/96   Pulse 63   Temp 98.3 F (36.8 C) (Oral)   Ht 4' 7.25" (1.403 m)   Wt 168 lb 4 oz (76.3 kg)   BMI 38.75 kg/m    Objective:   Physical Exam  Constitutional: She appears well-nourished.  Neck: Neck supple.  Cardiovascular: Normal rate and regular rhythm.  Respiratory: Effort normal and breath sounds normal.  Musculoskeletal:       Hands: Right dorsal hand pain with mild swelling as noted.  Skin: Skin is warm and dry. No erythema.           Assessment & Plan:  Cellulitis:  Resolved to left hand and upper extremity. Complete Keflex tonight. Follow up PRN  Pleas Koch, NP

## 2018-05-02 NOTE — Assessment & Plan Note (Addendum)
Intermittent flares with active flare to right hand, left shoulder, left knee. Check labs today to rule out RA given recurrent flares with evidence of swelling. Continue diclofenac, discussed to use Tylenol as needed in between.  Consider rheumatology vs orthopedic referral if needed. Recommended to remain active during the day.  Rx provided for wheelchair.

## 2018-05-02 NOTE — Patient Instructions (Signed)
Stop by the lab prior to leaving today. I will notify you of your results once received.   Take the wheelchair prescription to the pharmacy.  Stop by and speak with Rosaria Ferries regarding options for home health aid.  Start monitoring your blood pressure at home around the same time of day using the same arm.   Start hydrochlorothiazide 12.5 mg tablets for blood pressure and fluid retention. Take 1 tablet by mouth once daily.  You can use the diclofenac medication twice daily as needed for pain and inflammation. You can alternate with Tylenol in between doses.   Schedule a follow up visit in 2-3 weeks for blood pressure check.   It was a pleasure to see you today!

## 2018-05-02 NOTE — Assessment & Plan Note (Addendum)
Diagnosed years ago and was managed on HCTZ per patient. Uncontrolled now. Will start with low dose HCTZ 12.5 mg for BP and lower extremity edema. Follow up in 2-3 weeks for BP check and BMP.

## 2018-05-03 LAB — CBC WITH DIFFERENTIAL/PLATELET
Basophils Absolute: 0.1 10*3/uL (ref 0.0–0.1)
Basophils Relative: 1.1 % (ref 0.0–3.0)
Eosinophils Absolute: 0.4 10*3/uL (ref 0.0–0.7)
Eosinophils Relative: 3.5 % (ref 0.0–5.0)
HCT: 36.5 % (ref 36.0–46.0)
Hemoglobin: 11.9 g/dL — ABNORMAL LOW (ref 12.0–15.0)
Lymphocytes Relative: 15.2 % (ref 12.0–46.0)
Lymphs Abs: 1.7 10*3/uL (ref 0.7–4.0)
MCHC: 32.5 g/dL (ref 30.0–36.0)
MCV: 83.8 fl (ref 78.0–100.0)
Monocytes Absolute: 0.8 10*3/uL (ref 0.1–1.0)
Monocytes Relative: 7.2 % (ref 3.0–12.0)
Neutro Abs: 8.4 10*3/uL — ABNORMAL HIGH (ref 1.4–7.7)
Neutrophils Relative %: 73 % (ref 43.0–77.0)
Platelets: 447 10*3/uL — ABNORMAL HIGH (ref 150.0–400.0)
RBC: 4.36 Mil/uL (ref 3.87–5.11)
RDW: 14.7 % (ref 11.5–15.5)
WBC: 11.5 10*3/uL — ABNORMAL HIGH (ref 4.0–10.5)

## 2018-05-03 LAB — RHEUMATOID FACTOR: Rhuematoid fact SerPl-aCnc: <14 IU/mL (ref <14)

## 2018-05-03 LAB — CYCLIC CITRUL PEPTIDE ANTIBODY, IGG: Cyclic Citrullin Peptide Ab: <16 UNITS

## 2018-05-03 LAB — SEDIMENTATION RATE: Sed Rate: 108 mm/hr — ABNORMAL HIGH (ref 0–30)

## 2018-05-04 ENCOUNTER — Ambulatory Visit: Payer: PRIVATE HEALTH INSURANCE | Admitting: Primary Care

## 2018-05-05 ENCOUNTER — Other Ambulatory Visit: Payer: Self-pay | Admitting: Primary Care

## 2018-05-05 DIAGNOSIS — M8949 Other hypertrophic osteoarthropathy, multiple sites: Secondary | ICD-10-CM

## 2018-05-05 DIAGNOSIS — M150 Primary generalized (osteo)arthritis: Principal | ICD-10-CM

## 2018-05-05 DIAGNOSIS — M2540 Effusion, unspecified joint: Secondary | ICD-10-CM

## 2018-05-05 DIAGNOSIS — M159 Polyosteoarthritis, unspecified: Secondary | ICD-10-CM

## 2018-05-05 MED ORDER — PREDNISONE 20 MG PO TABS: ORAL_TABLET | ORAL | 0 refills | Status: DC

## 2018-05-06 ENCOUNTER — Ambulatory Visit (INDEPENDENT_AMBULATORY_CARE_PROVIDER_SITE_OTHER)
Admission: RE | Admit: 2018-05-06 | Discharge: 2018-05-06 | Disposition: A | Discharge status: Home / Self Care | Payer: Medicare Other | Source: Ambulatory Visit | Attending: Primary Care | Admitting: Primary Care

## 2018-05-06 DIAGNOSIS — M150 Primary generalized (osteo)arthritis: Secondary | ICD-10-CM

## 2018-05-06 DIAGNOSIS — M159 Polyosteoarthritis, unspecified: Secondary | ICD-10-CM

## 2018-05-06 DIAGNOSIS — M1812 Unilateral primary osteoarthritis of first carpometacarpal joint, left hand: Secondary | ICD-10-CM | POA: Diagnosis not present

## 2018-05-06 DIAGNOSIS — M1811 Unilateral primary osteoarthritis of first carpometacarpal joint, right hand: Secondary | ICD-10-CM | POA: Diagnosis not present

## 2018-05-06 DIAGNOSIS — M2540 Effusion, unspecified joint: Secondary | ICD-10-CM | POA: Diagnosis not present

## 2018-05-06 DIAGNOSIS — M8949 Other hypertrophic osteoarthropathy, multiple sites: Secondary | ICD-10-CM

## 2018-05-09 ENCOUNTER — Other Ambulatory Visit: Payer: Self-pay | Admitting: Primary Care

## 2018-05-09 DIAGNOSIS — M2540 Effusion, unspecified joint: Secondary | ICD-10-CM

## 2018-05-09 DIAGNOSIS — M25542 Pain in joints of left hand: Secondary | ICD-10-CM

## 2018-05-09 DIAGNOSIS — M25541 Pain in joints of right hand: Secondary | ICD-10-CM

## 2018-05-23 ENCOUNTER — Encounter: Payer: Self-pay | Admitting: Primary Care

## 2018-05-23 ENCOUNTER — Ambulatory Visit (INDEPENDENT_AMBULATORY_CARE_PROVIDER_SITE_OTHER): Payer: Medicare Other | Admitting: Primary Care

## 2018-05-23 DIAGNOSIS — R739 Hyperglycemia, unspecified: Secondary | ICD-10-CM | POA: Insufficient documentation

## 2018-05-23 DIAGNOSIS — M150 Primary generalized (osteo)arthritis: Secondary | ICD-10-CM | POA: Diagnosis not present

## 2018-05-23 DIAGNOSIS — E785 Hyperlipidemia, unspecified: Secondary | ICD-10-CM | POA: Insufficient documentation

## 2018-05-23 DIAGNOSIS — M8949 Other hypertrophic osteoarthropathy, multiple sites: Secondary | ICD-10-CM

## 2018-05-23 DIAGNOSIS — I10 Essential (primary) hypertension: Secondary | ICD-10-CM | POA: Diagnosis not present

## 2018-05-23 DIAGNOSIS — F411 Generalized anxiety disorder: Secondary | ICD-10-CM | POA: Insufficient documentation

## 2018-05-23 DIAGNOSIS — M159 Polyosteoarthritis, unspecified: Secondary | ICD-10-CM

## 2018-05-23 DIAGNOSIS — I878 Other specified disorders of veins: Secondary | ICD-10-CM | POA: Insufficient documentation

## 2018-05-23 DIAGNOSIS — I7090 Unspecified atherosclerosis: Secondary | ICD-10-CM | POA: Insufficient documentation

## 2018-05-23 MED ORDER — LISINOPRIL 10 MG PO TABS: 10.0000 mg | ORAL_TABLET | Freq: Every day | ORAL | 0 refills | Status: DC

## 2018-05-23 NOTE — Assessment & Plan Note (Signed)
Improved on HCTZ 12.5 mg which is causing dizziness. Will switch to lisinopril 10 mg and have her monitor BP at home. BMP in 2 weeks.

## 2018-05-23 NOTE — Progress Notes (Signed)
Subjective:    Patient ID: Alyssa Roy, female    DOB: [birth year], 84 y.o.   MRN: [id number]  HPI  Ms. Echavarria is an 84 year old female who presents today for follow up of hypertension.  She was last evaluated on 05/02/18 with elevated blood pressure readings. She was once managed on lisinopril-HCTZ in the past for hypertension, able to come off due to weight loss. During her last visit she was initiated on HCTZ 12.5 mg daily for uncontrolled readings.   BP Readings from Last 3 Encounters:  05/23/18 (!) 142/82  05/02/18 (!) 180/96  04/26/18 134/74   Since her last visit she's checking her BP at home which is running 110's-130's/70's-80's. She's noticed feeling dizzy throughout the day, denies falls. Some headaches.   Review of Systems  Eyes: Negative for visual disturbance.  Respiratory: Negative for shortness of breath.   Cardiovascular: Negative for chest pain.  Neurological: Positive for dizziness and headaches.       Past Medical History:  Diagnosis Date  . Atherosclerosis   . Chronic venous stasis   . GAD (generalized anxiety disorder)   . Hyperglycemia   . Hyperlipidemia   . Insomnia   . Osteoarthritis   . Urinary tract infection      Social History   Socioeconomic History  . Marital status: Unknown    Spouse name: Not on file  . Number of children: Not on file  . Years of education: Not on file  . Highest education level: Not on file  Occupational History  . Not on file  Social Needs  . Financial resource strain: Not on file  . Food insecurity:    Worry: Not on file    Inability: Not on file  . Transportation needs:    Medical: Not on file    Non-medical: Not on file  Tobacco Use  . Smoking status: Never Smoker  . Smokeless tobacco: Never Used  Substance and Sexual Activity  . Alcohol use: Never    Frequency: Never  . Drug use: Not on file  . Sexual activity: Not on file  Lifestyle  . Physical activity:    Days per week: Not on file    Minutes per session: Not on file  . Stress: Not on file  Relationships  . Social connections:    Talks on phone: Not on file    Gets together: Not on file    Attends religious service: Not on file    Active member of club or organization: Not on file    Attends meetings of clubs or organizations: Not on file    Relationship status: Not on file  . Intimate partner violence:    Fear of current or ex partner: Not on file    Emotionally abused: Not on file    Physically abused: Not on file    Forced sexual activity: Not on file  Other Topics Concern  . Not on file  Social History Narrative   Widow.   Retired.   Moved to [place] to live closer to daughter.    Past Surgical History:  Procedure Laterality Date  . ABDOMINAL HYSTERECTOMY  1979  . CHOLECYSTECTOMY  1984  . REPLACEMENT TOTAL KNEE Right 2007    Family History  Problem Relation Age of Onset  . Heart disease Mother   . Arthritis Mother   . Diabetes Father     Allergies  Allergen Reactions  . Crestor [Rosuvastatin Calcium]   . Feldene [Piroxicam]   .  Keflex [Cephalexin]   . Lipitor [Atorvastatin Calcium]   . Zetia [Ezetimibe]     Current Outpatient Medications on File Prior to Visit  Medication Sig Dispense Refill  . traMADol (ULTRAM) 50 MG tablet TAKE 1 TABLET BY MOUTH EVERY 8 HOURS AS NEEDED FOR PAIN. USE SPARINGLY. 60 tablet 0  . zolpidem (AMBIEN) 5 MG tablet Take 1 tablet (5 mg total) by mouth at bedtime as needed for sleep. 90 tablet 0   No current facility-administered medications on file prior to visit.     BP (!) 142/82   Pulse 60   Temp 98.2 F (36.8 C) (Oral)   Ht 4' 7.25" (1.403 m)   Wt 161 lb 8 oz (73.3 kg)   SpO2 97%   BMI 37.20 kg/m    Objective:   Physical Exam  Constitutional: She appears well-nourished.  Neck: Neck supple.  Cardiovascular: Normal rate and regular rhythm.  Respiratory: Effort normal and breath sounds normal.  Skin: Skin is warm and dry.             Assessment & Plan:

## 2018-05-23 NOTE — Assessment & Plan Note (Signed)
Labs with elevated sed rate. Did well on prednisone, symptoms returned once course complete. Question PMR given location of joint pain. She has an appointment with rheumatology in a few weeks.

## 2018-05-23 NOTE — Patient Instructions (Signed)
Stop taking hydrochlorothiazide 12.5 mg tablets for blood pressure.  Start lisinopril 10 mg tablets for blood pressure.   Monitor your blood pressure and report readings at or above 140/90.  Schedule a lab only appointment for 2 weeks to recheck kidneys and electrolytes.  It was a pleasure to see you today!

## 2018-05-24 ENCOUNTER — Other Ambulatory Visit: Payer: Self-pay | Admitting: Primary Care

## 2018-05-24 DIAGNOSIS — M159 Polyosteoarthritis, unspecified: Secondary | ICD-10-CM

## 2018-05-24 DIAGNOSIS — M8949 Other hypertrophic osteoarthropathy, multiple sites: Secondary | ICD-10-CM

## 2018-05-24 DIAGNOSIS — M150 Primary generalized (osteo)arthritis: Principal | ICD-10-CM

## 2018-06-06 ENCOUNTER — Other Ambulatory Visit: Payer: Self-pay | Admitting: Primary Care

## 2018-06-06 DIAGNOSIS — M150 Primary generalized (osteo)arthritis: Secondary | ICD-10-CM | POA: Diagnosis not present

## 2018-06-06 DIAGNOSIS — M1990 Unspecified osteoarthritis, unspecified site: Secondary | ICD-10-CM | POA: Diagnosis not present

## 2018-06-06 DIAGNOSIS — M353 Polymyalgia rheumatica: Secondary | ICD-10-CM | POA: Insufficient documentation

## 2018-06-06 DIAGNOSIS — Z85828 Personal history of other malignant neoplasm of skin: Secondary | ICD-10-CM

## 2018-06-06 DIAGNOSIS — Z13820 Encounter for screening for osteoporosis: Secondary | ICD-10-CM | POA: Diagnosis not present

## 2018-06-06 DIAGNOSIS — R700 Elevated erythrocyte sedimentation rate: Secondary | ICD-10-CM | POA: Diagnosis not present

## 2018-06-06 DIAGNOSIS — Z1239 Encounter for other screening for malignant neoplasm of breast: Secondary | ICD-10-CM

## 2018-06-06 DIAGNOSIS — E559 Vitamin D deficiency, unspecified: Secondary | ICD-10-CM | POA: Diagnosis not present

## 2018-06-07 ENCOUNTER — Other Ambulatory Visit: Payer: PRIVATE HEALTH INSURANCE

## 2018-06-18 ENCOUNTER — Other Ambulatory Visit: Payer: Self-pay | Admitting: Primary Care

## 2018-06-18 DIAGNOSIS — I10 Essential (primary) hypertension: Secondary | ICD-10-CM

## 2018-06-18 DIAGNOSIS — M159 Polyosteoarthritis, unspecified: Secondary | ICD-10-CM

## 2018-06-18 DIAGNOSIS — M8949 Other hypertrophic osteoarthropathy, multiple sites: Secondary | ICD-10-CM

## 2018-06-18 DIAGNOSIS — M150 Primary generalized (osteo)arthritis: Secondary | ICD-10-CM

## 2018-06-20 DIAGNOSIS — M150 Primary generalized (osteo)arthritis: Secondary | ICD-10-CM | POA: Diagnosis not present

## 2018-06-20 DIAGNOSIS — E559 Vitamin D deficiency, unspecified: Secondary | ICD-10-CM | POA: Diagnosis not present

## 2018-06-20 DIAGNOSIS — M1990 Unspecified osteoarthritis, unspecified site: Secondary | ICD-10-CM | POA: Diagnosis not present

## 2018-06-20 DIAGNOSIS — M353 Polymyalgia rheumatica: Secondary | ICD-10-CM | POA: Diagnosis not present

## 2018-06-20 DIAGNOSIS — R700 Elevated erythrocyte sedimentation rate: Secondary | ICD-10-CM | POA: Diagnosis not present

## 2018-06-20 NOTE — Telephone Encounter (Signed)
How is her blood pressure running on lisinopril 10 mg? Also, I made a note stating that she is not using tramadol.  I prefer she avoid this medication if possible.  Has she been taking?  What else has she been taking for her symptoms?

## 2018-06-20 NOTE — Telephone Encounter (Signed)
traMADol (ULTRAM) 50 MG tablet - last prescribed on 04/21/2018 #60   lisinopril (PRINIVIL,ZESTRIL) 10 MG tablet - last prescribed on 05/23/2018  Last seen on 05/23/2018

## 2018-06-21 ENCOUNTER — Other Ambulatory Visit: Payer: Self-pay | Admitting: *Deleted

## 2018-06-21 NOTE — Telephone Encounter (Signed)
Spoken to patient. She gave me BP reading for the past 2 days, 147/72 and 144/72. Patient is using tramadol. She has been using this for her back pain. She has been taking tramadol once a day or at times every other day depending on her pain. She does sometimes take Tylenol instead but rarely.

## 2018-06-21 NOTE — Telephone Encounter (Signed)
Please notify patient:  It can be dangerous to take tramadol daily and therefore we need to find an alternative.  Did she meet with a rheumatologist?  Do they have any recommendations for her pain? Has she ever tried prescription strength topical gel?  She does need a BMP since starting lisinopril.  Please schedule lab only appointment at her convenience.

## 2018-06-21 NOTE — Telephone Encounter (Addendum)
Noted.  It is really not safe to take tramadol every other day.  We can discuss other treatment options that are non-narcotic and not high risk for dependence or drowsiness anytime at her convenience.  The regimen that her rheumatologist has her on, or may put her on, may help with her back pain.

## 2018-06-21 NOTE — Telephone Encounter (Signed)
Spoken to patient and she stated that she is not taking daily. She only takes when she has back pain back which usually is every other day. She didn't take any today. Yes, she has meet with rheumatologist and was given prednisone 5 mg - taking 3 tablet once daily.   She does not want anything that need to rub into the skin. She can't reach that area of her back. She would rather have a pill.  Patient stated that she is going to see rheumatologist again in a couple weeks and does not want to make another lab appointment if she is getting labs there. I try to tell her that the labs are the same.

## 2018-06-23 NOTE — Telephone Encounter (Signed)
Spoken and notified patient of [name]'s comments. Patient verbalized understanding.  

## 2018-06-27 ENCOUNTER — Encounter: Payer: Self-pay | Admitting: Primary Care

## 2018-06-27 ENCOUNTER — Ambulatory Visit (INDEPENDENT_AMBULATORY_CARE_PROVIDER_SITE_OTHER): Payer: Medicare Other | Admitting: Primary Care

## 2018-06-27 DIAGNOSIS — G4700 Insomnia, unspecified: Secondary | ICD-10-CM | POA: Diagnosis not present

## 2018-06-27 DIAGNOSIS — I10 Essential (primary) hypertension: Secondary | ICD-10-CM | POA: Diagnosis not present

## 2018-06-27 DIAGNOSIS — M150 Primary generalized (osteo)arthritis: Secondary | ICD-10-CM | POA: Diagnosis not present

## 2018-06-27 DIAGNOSIS — M8949 Other hypertrophic osteoarthropathy, multiple sites: Secondary | ICD-10-CM

## 2018-06-27 DIAGNOSIS — M159 Polyosteoarthritis, unspecified: Secondary | ICD-10-CM

## 2018-06-27 MED ORDER — LISINOPRIL 20 MG PO TABS: 20.0000 mg | ORAL_TABLET | Freq: Every day | ORAL | 0 refills | Status: DC

## 2018-06-27 NOTE — Assessment & Plan Note (Signed)
Above goal in the office today, also with home readings. Could be secondary to chronic prednisone use. Increase lisinopril to 20 mg. Follow up in 3 weeks for BP check and BMP.

## 2018-06-27 NOTE — Assessment & Plan Note (Signed)
Does well on diclofenac PRN. Discussed to stop Tramadol given age and risk for falls. She will update.

## 2018-06-27 NOTE — Progress Notes (Signed)
Subjective:    Patient ID: Alyssa Roy, female    DOB: 09-20-1932, 84 y.o.   MRN: [id number]  HPI  Alyssa Roy is an 84 year old female with a history of primary osteoarthritis, chronic back pain, chronic hand pain who presents today for follow up.  1) Chronic Back Pain: She is currently managed on Tramadol 50 mg for which she takes once weekly on average. She is also taking Diclofenac which has helped with pain. She is managed on prednisone per Rheumatology, slowly weaning down and will finish in February 2020.  2) Essential Hypertension: currently managed on lisinopril 10 mg. She is checking her BP at home which is running 130-150's/70-90's. She denies dizziness, chest pain. She is on prednisone chronically per her rheumatologist.   BP Readings from Last 3 Encounters:  06/27/18 (!) 152/100  05/23/18 (!) 142/82  05/02/18 (!) 180/96   3) Insomnia: Difficulty falling and staying asleep. Will wake around 2 am and unable to fall back asleep. She is managed on Ambien for which she's taken intermittently for years, doesn't find it to be effective. She plans on trying Melatonin.   Review of Systems  Respiratory: Negative for shortness of breath.   Cardiovascular: Negative for chest pain.  Musculoskeletal: Positive for back pain.  Neurological: Positive for headaches. Negative for dizziness and weakness.  Psychiatric/Behavioral: Positive for sleep disturbance.       Past Medical History:  Diagnosis Date  . Atherosclerosis   . Chronic venous stasis   . GAD (generalized anxiety disorder)   . Hyperglycemia   . Hyperlipidemia   . Insomnia   . Osteoarthritis   . Urinary tract infection      Social History   Socioeconomic History  . Marital status: Unknown    Spouse name: Not on file  . Number of children: Not on file  . Years of education: Not on file  . Highest education level: Not on file  Occupational History  . Not on file  Social Needs  . Financial resource  strain: Not on file  . Food insecurity:    Worry: Not on file    Inability: Not on file  . Transportation needs:    Medical: Not on file    Non-medical: Not on file  Tobacco Use  . Smoking status: Never Smoker  . Smokeless tobacco: Never Used  Substance and Sexual Activity  . Alcohol use: Never    Frequency: Never  . Drug use: Not on file  . Sexual activity: Not on file  Lifestyle  . Physical activity:    Days per week: Not on file    Minutes per session: Not on file  . Stress: Not on file  Relationships  . Social connections:    Talks on phone: Not on file    Gets together: Not on file    Attends religious service: Not on file    Active member of club or organization: Not on file    Attends meetings of clubs or organizations: Not on file    Relationship status: Not on file  . Intimate partner violence:    Fear of current or ex partner: Not on file    Emotionally abused: Not on file    Physically abused: Not on file    Forced sexual activity: Not on file  Other Topics Concern  . Not on file  Social History Narrative   Widow.   Retired.   Moved to [place] to live closer to daughter.  Past Surgical History:  Procedure Laterality Date  . ABDOMINAL HYSTERECTOMY  1979  . CHOLECYSTECTOMY  1984  . REPLACEMENT TOTAL KNEE Right 2007    Family History  Problem Relation Age of Onset  . Heart disease Mother   . Arthritis Mother   . Diabetes Father     Allergies  Allergen Reactions  . Crestor [Rosuvastatin Calcium]   . Feldene [Piroxicam]   . Keflex [Cephalexin]   . Lipitor [Atorvastatin Calcium]   . Zetia [Ezetimibe]     Current Outpatient Medications on File Prior to Visit  Medication Sig Dispense Refill  . lisinopril (PRINIVIL,ZESTRIL) 10 MG tablet TAKE 1 TABLET BY MOUTH EVERY DAY FOR BLOOD PRESSURE 90 tablet 1  . predniSONE (DELTASONE) 5 MG tablet Take 3 tabs (all once ) in the morning, 30 days    . traMADol (ULTRAM) 50 MG tablet TAKE 1 TABLET BY MOUTH  EVERY 8 HOURS AS NEEDED FOR PAIN. USE SPARINGLY. 60 tablet 0  . zolpidem (AMBIEN) 5 MG tablet Take 1 tablet (5 mg total) by mouth at bedtime as needed for sleep. 90 tablet 0   No current facility-administered medications on file prior to visit.     BP (!) 152/100   Pulse 77   Temp 98 F (36.7 C) (Oral)   Ht 4' 7.25" (1.403 m)   Wt 158 lb (71.7 kg)   SpO2 97%   BMI 36.39 kg/m    Objective:   Physical Exam  Constitutional: She appears well-nourished.  Neck: Neck supple.  Cardiovascular: Normal rate and regular rhythm.  Respiratory: Effort normal and breath sounds normal.  Skin: Skin is warm and dry.  Psychiatric: She has a normal mood and affect.           Assessment & Plan:

## 2018-06-27 NOTE — Patient Instructions (Signed)
Stop taking Tramadol and Ambien.  Use the diclofenac medication for arthritis pain, take with a large meal to prevent GI upset.   You can try Melatonin for sleep, do not exceed 10 mg in 24 hours.  We've increased the dose of your lisinopril to 20 mg. You may take two of the 10 mg tablets to equal 20 mg until your bottle is empty.  Schedule a follow up visit in 3 weeks for blood pressure check.  It was a pleasure to see you today!

## 2018-06-27 NOTE — Assessment & Plan Note (Signed)
Stop Zolpidem. Try Melatonin. She will update.

## 2018-07-14 ENCOUNTER — Telehealth: Payer: Self-pay

## 2018-07-14 NOTE — Telephone Encounter (Signed)
Pt left v/m requesting cb about blood in urine. I called pt and got busy signal; will try later.

## 2018-07-14 NOTE — Telephone Encounter (Addendum)
Pt had a lower back pain;this morning pt had  Normal BM with blood in water of commode. Later pt wiped and saw spots of blood. Lower back still hurt and middle of abdomen hurts a little bit. No burning or pain when urinates. Pt drinking a lot of water and pt voiding frequently. No fever, no confusion. Pt just moved here and pts daughter in law is out until tomorrow but not sure of her schedule. Pt does not drive. Pt wants to know if pt can do something like drink cranberry juice or what to do. If pt condition worsens or pt sees more blood or abd pain worsens pt will call 911 and go to ED. [facility].pt request cb.

## 2018-07-15 NOTE — Telephone Encounter (Signed)
Spoken to patient and she stated that she is okay. She always had back pain but no abdominal pain, no N/V. Will see patient as schedule.

## 2018-07-15 NOTE — Telephone Encounter (Signed)
Patient needs evaluation. Has she had any more symptoms (back pain, blood in the stool, abdominal pain)? Is she running any fevers, having nausea/vomiting? If no to those questions then okay to have her seen next week with [place]. If yes then needs sooner evaluation with anyone available or ED.

## 2018-07-18 DIAGNOSIS — R700 Elevated erythrocyte sedimentation rate: Secondary | ICD-10-CM | POA: Diagnosis not present

## 2018-07-18 DIAGNOSIS — M353 Polymyalgia rheumatica: Secondary | ICD-10-CM | POA: Diagnosis not present

## 2018-07-18 DIAGNOSIS — E559 Vitamin D deficiency, unspecified: Secondary | ICD-10-CM | POA: Diagnosis not present

## 2018-07-21 ENCOUNTER — Encounter: Payer: Self-pay | Admitting: Primary Care

## 2018-07-21 ENCOUNTER — Ambulatory Visit (INDEPENDENT_AMBULATORY_CARE_PROVIDER_SITE_OTHER): Payer: Medicare Other | Admitting: Primary Care

## 2018-07-21 VITALS — BP 132/90 | HR 84 | Temp 97.9°F | Ht <= 58 in | Wt 167.0 lb

## 2018-07-21 DIAGNOSIS — M150 Primary generalized (osteo)arthritis: Secondary | ICD-10-CM

## 2018-07-21 DIAGNOSIS — I10 Essential (primary) hypertension: Secondary | ICD-10-CM | POA: Diagnosis not present

## 2018-07-21 DIAGNOSIS — R319 Hematuria, unspecified: Secondary | ICD-10-CM | POA: Diagnosis not present

## 2018-07-21 DIAGNOSIS — M8949 Other hypertrophic osteoarthropathy, multiple sites: Secondary | ICD-10-CM

## 2018-07-21 DIAGNOSIS — M159 Polyosteoarthritis, unspecified: Secondary | ICD-10-CM

## 2018-07-21 LAB — POC URINALSYSI DIPSTICK (AUTOMATED)
Bilirubin, UA: NEGATIVE
Glucose, UA: NEGATIVE
Ketones, UA: NEGATIVE
Nitrite, UA: NEGATIVE
Protein, UA: NEGATIVE
Spec Grav, UA: >=1.03 — AB (ref 1.010–1.025)
Urobilinogen, UA: 0.2 E.U./dL
pH, UA: 5.5 (ref 5.0–8.0)

## 2018-07-21 NOTE — Progress Notes (Signed)
Subjective:    Patient ID: Alyssa Roy, female    DOB: 1933/04/12, 85 y.o.   MRN: [id number]  HPI  Alyssa Roy is a 85 year old female who presents today for follow up of hypertension. She is also requesting a urinalysis and to discuss back pain.   She was last evaluated on 06/27/18 for elevated blood pressure readings despite management on 10 mg. Given elevated readings her lisinopril was increase to 20 mg and she was asked to return for follow up.   BP Readings from Last 3 Encounters:  07/21/18 132/90  06/27/18 (!) 152/100  05/23/18 (!) 142/82   She's been checking her BP at home which is running 110's-160's/70's-80's. Most of her readings are 110's-120's/70's-80's.   She has chronic back pain and is taking Tylenol 500 mg at bedtime with improvement. Sometimes will have to take an extra tablet during the day.  She called in on 07/14/18 with reports of hematuria.  She is not sure if this was from her hemorrhoids or not.  She also reports right lower quadrant pain, right side pain. She did have some dysuria initially but this has dissipated. She denies urinary frequency. She's not seen any more blood since 07/14/18.    Review of Systems  Constitutional: Negative for fever.  Respiratory: Negative for cough.   Gastrointestinal: Positive for abdominal pain.  Genitourinary: Negative for dysuria, flank pain, frequency and hematuria.  Neurological: Negative for dizziness and headaches.       Past Medical History:  Diagnosis Date  . Atherosclerosis   . Chronic venous stasis   . GAD (generalized anxiety disorder)   . Hyperglycemia   . Hyperlipidemia   . Insomnia   . Osteoarthritis   . Urinary tract infection      Social History   Socioeconomic History  . Marital status: Unknown    Spouse name: Not on file  . Number of children: Not on file  . Years of education: Not on file  . Highest education level: Not on file  Occupational History  . Not on file  Social Needs   . Financial resource strain: Not on file  . Food insecurity:    Worry: Not on file    Inability: Not on file  . Transportation needs:    Medical: Not on file    Non-medical: Not on file  Tobacco Use  . Smoking status: Never Smoker  . Smokeless tobacco: Never Used  Substance and Sexual Activity  . Alcohol use: Never    Frequency: Never  . Drug use: Not on file  . Sexual activity: Not on file  Lifestyle  . Physical activity:    Days per week: Not on file    Minutes per session: Not on file  . Stress: Not on file  Relationships  . Social connections:    Talks on phone: Not on file    Gets together: Not on file    Attends religious service: Not on file    Active member of club or organization: Not on file    Attends meetings of clubs or organizations: Not on file    Relationship status: Not on file  . Intimate partner violence:    Fear of current or ex partner: Not on file    Emotionally abused: Not on file    Physically abused: Not on file    Forced sexual activity: Not on file  Other Topics Concern  . Not on file  Social History Narrative   Widow.  Retired.   Moved to [place] to live closer to daughter.    Past Surgical History:  Procedure Laterality Date  . ABDOMINAL HYSTERECTOMY  1979  . CHOLECYSTECTOMY  1984  . REPLACEMENT TOTAL KNEE Right 2007    Family History  Problem Relation Age of Onset  . Heart disease Mother   . Arthritis Mother   . Diabetes Father     Allergies  Allergen Reactions  . Crestor [Rosuvastatin Calcium]   . Feldene [Piroxicam]   . Keflex [Cephalexin]   . Lipitor [Atorvastatin Calcium]   . Zetia [Ezetimibe]     Current Outpatient Medications on File Prior to Visit  Medication Sig Dispense Refill  . lisinopril (PRINIVIL,ZESTRIL) 20 MG tablet Take 1 tablet (20 mg total) by mouth daily. For blood pressure. 90 tablet 0  . predniSONE (DELTASONE) 5 MG tablet Take 10 mg by mouth daily with breakfast.      No current  facility-administered medications on file prior to visit.     BP 132/90 (BP Location: Left Arm, Patient Position: Sitting, Cuff Size: Large)   Pulse 84   Temp 97.9 F (36.6 C) (Oral)   Ht 4' 7.5" (1.41 m)   Wt 167 lb (75.8 kg)   SpO2 96%   BMI 38.12 kg/m    Objective:   Physical Exam  Constitutional: She appears well-nourished.  Neck: Neck supple.  Cardiovascular: Normal rate and regular rhythm.  Respiratory: Effort normal and breath sounds normal.  GI: Soft. There is abdominal tenderness in the suprapubic area. There is no CVA tenderness.    Skin: Skin is warm and dry.           Assessment & Plan:  Hematuria:  Noticed on one occasion.  Also with some right suprapubic discomfort. UA today with 1+ leuk, trace blood, negative nitrites. Culture sent and is pending. We will hold off on treatment until we receive culture results. Discussed to push intake of water and monitor symptoms. She appears well on exam and is in no distress.  Pleas Koch, NP

## 2018-07-21 NOTE — Assessment & Plan Note (Signed)
Discussed to increase Tylenol to 500 mg 3 times daily.

## 2018-07-21 NOTE — Assessment & Plan Note (Signed)
Home blood pressure readings are stable.  Continue lisinopril 20 mg. BMP drawn from June 06, 2018 reviewed in care everywhere.

## 2018-07-21 NOTE — Patient Instructions (Addendum)
Continue taking lisinopril 20 mg for blood pressure.  Stop by the lab prior to leaving today. I will notify you of your results once received.   Take Tylenol 500 mg three times daily, everyday for your back pain.   I will be in touch with your urine results once received.   It was a pleasure to see you today!

## 2018-07-23 LAB — URINE CULTURE
MICRO NUMBER:: 33762
SPECIMEN QUALITY:: ADEQUATE

## 2018-07-24 ENCOUNTER — Other Ambulatory Visit: Payer: Self-pay | Admitting: Primary Care

## 2018-07-24 DIAGNOSIS — N3001 Acute cystitis with hematuria: Secondary | ICD-10-CM

## 2018-07-24 MED ORDER — SULFAMETHOXAZOLE-TRIMETHOPRIM 800-160 MG PO TABS: 1.0000 | ORAL_TABLET | Freq: Two times a day (BID) | ORAL | 0 refills | Status: DC

## 2018-08-22 DIAGNOSIS — Z7952 Long term (current) use of systemic steroids: Secondary | ICD-10-CM | POA: Diagnosis not present

## 2018-08-22 DIAGNOSIS — M353 Polymyalgia rheumatica: Secondary | ICD-10-CM | POA: Diagnosis not present

## 2018-08-30 DIAGNOSIS — D2271 Melanocytic nevi of right lower limb, including hip: Secondary | ICD-10-CM | POA: Diagnosis not present

## 2018-08-30 DIAGNOSIS — D2272 Melanocytic nevi of left lower limb, including hip: Secondary | ICD-10-CM | POA: Diagnosis not present

## 2018-08-30 DIAGNOSIS — D225 Melanocytic nevi of trunk: Secondary | ICD-10-CM | POA: Diagnosis not present

## 2018-08-30 DIAGNOSIS — D2261 Melanocytic nevi of right upper limb, including shoulder: Secondary | ICD-10-CM | POA: Diagnosis not present

## 2018-08-30 DIAGNOSIS — Z08 Encounter for follow-up examination after completed treatment for malignant neoplasm: Secondary | ICD-10-CM | POA: Diagnosis not present

## 2018-08-30 DIAGNOSIS — D2262 Melanocytic nevi of left upper limb, including shoulder: Secondary | ICD-10-CM | POA: Diagnosis not present

## 2018-08-30 DIAGNOSIS — L821 Other seborrheic keratosis: Secondary | ICD-10-CM | POA: Diagnosis not present

## 2018-08-30 DIAGNOSIS — Z85828 Personal history of other malignant neoplasm of skin: Secondary | ICD-10-CM | POA: Diagnosis not present

## 2018-09-19 ENCOUNTER — Other Ambulatory Visit: Payer: Self-pay | Admitting: Primary Care

## 2018-09-19 DIAGNOSIS — I10 Essential (primary) hypertension: Secondary | ICD-10-CM

## 2018-11-21 DIAGNOSIS — M353 Polymyalgia rheumatica: Secondary | ICD-10-CM | POA: Diagnosis not present

## 2018-11-21 DIAGNOSIS — Z7952 Long term (current) use of systemic steroids: Secondary | ICD-10-CM | POA: Diagnosis not present

## 2018-11-24 DIAGNOSIS — M353 Polymyalgia rheumatica: Secondary | ICD-10-CM | POA: Diagnosis not present

## 2018-11-24 DIAGNOSIS — Z7952 Long term (current) use of systemic steroids: Secondary | ICD-10-CM | POA: Diagnosis not present

## 2018-12-29 DIAGNOSIS — Z7952 Long term (current) use of systemic steroids: Secondary | ICD-10-CM | POA: Diagnosis not present

## 2018-12-29 DIAGNOSIS — M353 Polymyalgia rheumatica: Secondary | ICD-10-CM | POA: Diagnosis not present

## 2019-01-03 ENCOUNTER — Telehealth: Payer: Self-pay | Admitting: *Deleted

## 2019-01-03 NOTE — Telephone Encounter (Signed)
Noted, will evaluate. 

## 2019-01-03 NOTE — Telephone Encounter (Signed)
Patient called stating that she has been having ankle swelling especially the left and severe heartburn for a couple of weeks. . Patient stated that she has had some chest pain and SOB that comes and goes for about 3-4 weeks. Patient denies any chest pain at this time. Patient stated that she is having some pain under her right ribcage area. Patient stated that she is going to have to find a ride for an appointment. Patient stated that she does not feel that she needs to go to the ER at this point. Patient scheduled for an appointment tomorrow 01/04/19 at 9:20. Advised patient that if she develops chest pain, SOB, nausea, tingling or numbness before the appointment she needs to call 911 or go to the ER and she verbalized understanding.

## 2019-01-04 ENCOUNTER — Other Ambulatory Visit: Payer: Self-pay | Admitting: Primary Care

## 2019-01-04 ENCOUNTER — Other Ambulatory Visit: Payer: Self-pay

## 2019-01-04 ENCOUNTER — Encounter: Payer: Self-pay | Admitting: Primary Care

## 2019-01-04 ENCOUNTER — Ambulatory Visit (INDEPENDENT_AMBULATORY_CARE_PROVIDER_SITE_OTHER): Payer: Medicare Other | Admitting: Primary Care

## 2019-01-04 VITALS — BP 144/84 | HR 87 | Temp 98.2°F | Ht <= 58 in | Wt 162.0 lb

## 2019-01-04 DIAGNOSIS — K219 Gastro-esophageal reflux disease without esophagitis: Secondary | ICD-10-CM | POA: Diagnosis not present

## 2019-01-04 DIAGNOSIS — R600 Localized edema: Secondary | ICD-10-CM | POA: Diagnosis not present

## 2019-01-04 DIAGNOSIS — I10 Essential (primary) hypertension: Secondary | ICD-10-CM

## 2019-01-04 DIAGNOSIS — R079 Chest pain, unspecified: Secondary | ICD-10-CM | POA: Diagnosis not present

## 2019-01-04 LAB — URIC ACID: Uric Acid, Serum: 6.3 mg/dL (ref 2.4–7.0)

## 2019-01-04 LAB — BASIC METABOLIC PANEL
BUN: 24 mg/dL — ABNORMAL HIGH (ref 6–23)
CO2: 28 mEq/L (ref 19–32)
Calcium: 9.3 mg/dL (ref 8.4–10.5)
Chloride: 105 mEq/L (ref 96–112)
Creatinine, Ser: 0.95 mg/dL (ref 0.40–1.20)
GFR: 56.01 mL/min — ABNORMAL LOW (ref >60.00)
Glucose, Bld: 110 mg/dL — ABNORMAL HIGH (ref 70–99)
Potassium: 4 mEq/L (ref 3.5–5.1)
Sodium: 142 mEq/L (ref 135–145)

## 2019-01-04 LAB — BRAIN NATRIURETIC PEPTIDE: Pro B Natriuretic peptide (BNP): 15 pg/mL (ref 0.0–100.0)

## 2019-01-04 MED ORDER — LISINOPRIL-HYDROCHLOROTHIAZIDE 20-12.5 MG PO TABS: 1.0000 | ORAL_TABLET | Freq: Every day | ORAL | 0 refills | Status: DC

## 2019-01-04 MED ORDER — FAMOTIDINE 20 MG PO TABS: 20.0000 mg | ORAL_TABLET | Freq: Every day | ORAL | 0 refills | Status: DC

## 2019-01-04 NOTE — Assessment & Plan Note (Addendum)
Chronic, intermittent for years. No worse than usual.  Based off of records she had a cardiac catheterization in 2003 which showed non obstructive CAD, then stress test in 2013 which was negative for ischemia. She was last evaluated by cardiology in 2015 and was to undergo abdominal ultrasound. It appears that this was not completed or records are not available.  Discussed getting her back in with cardiology for re-evaluation, she declines. Will check labs today including BNP, BMP.   ECG today with NSR rate of 78, no acute ischemia. No visual image of ECG from 1610 so uncertain if there are any changes.

## 2019-01-04 NOTE — Patient Instructions (Addendum)
Stop by the lab prior to leaving today. I will notify you of your results once received.   Start famotidine 20 mg once daily for heartburn as discussed.  Please update me if your chest pain increases or becomes more frequent.  Remember to elevate your legs when resting.   I'll be in touch soon regarding our next step.  It was a pleasure to see you today!

## 2019-01-04 NOTE — Assessment & Plan Note (Signed)
Intermittent for years, occurs most days of the week. Rx for famotidine 20 mg sent to pharmacy. She will update.

## 2019-01-04 NOTE — Progress Notes (Signed)
Subjective:    Patient ID: Alyssa Roy, female    DOB: 02-16-1933, 85 y.o.   MRN: [id number]  HPI  [name] is an 85 year old female with a history of hypertension, prediabetes, osteoarthritis who presents today with multiple complaints.  1) Lower Extremity Edema: Her swelling is located to the bilateral ankles which she first noticed three weeks ago. Her swelling is primarily located to the left ankle and lower portion of the left lower extremity, now noticing to the right ankle.   She endorses that she elevates her legs with resting. She denies being more sedentary than prior to Covid-19 pandemic. She is followed by rheumatology with her last visit being one month ago. She is managed on prednisone 3 mg daily, has been taking prednisone daily since October 2019.   She also endorses substernal chest pain that feels different than her GERD. This chest pain has been intermittently present for years. She underwent Myoview Stress test around 5 years ago per patient which was "normal". Her chest pain occurs with rest and movement and will resolve within a few second to a few minutes. Her pain is no worse than it was years ago.   Wt Readings from Last 3 Encounters:  01/04/19 162 lb (73.5 kg)  07/21/18 167 lb (75.8 kg)  06/27/18 158 lb (71.7 kg)     2) Essential Hypertension: Currently managed on lisinopril 20 mg daily. She's been checking her BP at home which is mostly running 120's-140's/70's-90's, but will get some readings of 110's/70's and 150's/90's. She is compliant to her lisinopril.   BP Readings from Last 3 Encounters:  01/04/19 (!) 144/84  07/21/18 132/90  06/27/18 (!) 152/100   3) GERD: Chronic for years, intermittent. Symptoms include esophageal burning mostly. Symptoms occurs several times weekly. She does not take anything OTC for her symptoms.   Review of Systems  Constitutional: Negative for unexpected weight change.  Respiratory: Negative for shortness of  breath.   Cardiovascular: Positive for chest pain and leg swelling.       See HPI  Gastrointestinal:       Intermittent esophageal burning  Neurological: Negative for dizziness and headaches.       Past Medical History:  Diagnosis Date  . Atherosclerosis   . Chronic venous stasis   . GAD (generalized anxiety disorder)   . Hyperglycemia   . Hyperlipidemia   . Insomnia   . Osteoarthritis   . Urinary tract infection      Social History   Socioeconomic History  . Marital status: Unknown    Spouse name: Not on file  . Number of children: Not on file  . Years of education: Not on file  . Highest education level: Not on file  Occupational History  . Not on file  Social Needs  . Financial resource strain: Not on file  . Food insecurity    Worry: Not on file    Inability: Not on file  . Transportation needs    Medical: Not on file    Non-medical: Not on file  Tobacco Use  . Smoking status: Never Smoker  . Smokeless tobacco: Never Used  Substance and Sexual Activity  . Alcohol use: Never    Frequency: Never  . Drug use: Not on file  . Sexual activity: Not on file  Lifestyle  . Physical activity    Days per week: Not on file    Minutes per session: Not on file  . Stress: Not on file  Relationships  . Social Herbalist on phone: Not on file    Gets together: Not on file    Attends religious service: Not on file    Active member of club or organization: Not on file    Attends meetings of clubs or organizations: Not on file    Relationship status: Not on file  . Intimate partner violence    Fear of current or ex partner: Not on file    Emotionally abused: Not on file    Physically abused: Not on file    Forced sexual activity: Not on file  Other Topics Concern  . Not on file  Social History Narrative   Widow.   Retired.   Moved to [place] to live closer to daughter.    Past Surgical History:  Procedure Laterality Date  . ABDOMINAL HYSTERECTOMY   1979  . CHOLECYSTECTOMY  1984  . REPLACEMENT TOTAL KNEE Right 2007    Family History  Problem Relation Age of Onset  . Heart disease Mother   . Arthritis Mother   . Diabetes Father     Allergies  Allergen Reactions  . Crestor [Rosuvastatin Calcium]   . Feldene [Piroxicam]   . Keflex [Cephalexin]   . Lipitor [Atorvastatin Calcium]   . Zetia [Ezetimibe]   . Aspirin Other (See Comments)    Stomach discomfort    Current Outpatient Medications on File Prior to Visit  Medication Sig Dispense Refill  . lisinopril (PRINIVIL,ZESTRIL) 20 MG tablet TAKE 1 TABLET (20 MG TOTAL) BY MOUTH DAILY. FOR BLOOD PRESSURE. 90 tablet 1  . predniSONE (DELTASONE) 1 MG tablet Take 3 mg by mouth daily with breakfast.      No current facility-administered medications on file prior to visit.     BP (!) 144/84   Pulse 87   Temp 98.2 F (36.8 C) (Tympanic)   Ht 4' 7.5" (1.41 m)   Wt 162 lb (73.5 kg)   SpO2 98%   BMI 36.98 kg/m    Objective:   Physical Exam  Constitutional: She is oriented to person, place, and time. She appears well-nourished.  Neck: Neck supple.  Cardiovascular: Normal rate and regular rhythm.  Moderate left ankle edema with mild edema proximal to left ankle. Mild right ankle edema. No pitting.  Respiratory: Effort normal and breath sounds normal.  Neurological: She is alert and oriented to person, place, and time.  Skin: Skin is warm and dry.  Psychiatric: She has a normal mood and affect.           Assessment & Plan:

## 2019-01-04 NOTE — Assessment & Plan Note (Signed)
Acute x 3 weeks. Could be secondary to prednisone as dose was increased back up to 3 mg daily. Also consider PVD, gout, uncontrolled hypertension, CHF.  Strongly advised she wear compression socks/hose for which she declines as she cannot put them on. Also recommended she become less sedentary, reminded her to elevate her legs when resting.  Labs pending. ECG appears stable. Consider low dose HCTZ , this may or may not help. Await labs.

## 2019-01-04 NOTE — Assessment & Plan Note (Signed)
Above goal with some home readings, borderline in our office during last several visits.  Consider adding HCTZ to lisinopril for extremity edema and better BP control. BMP and BNP pending.  Await labs.

## 2019-01-16 ENCOUNTER — Telehealth: Payer: Self-pay | Admitting: *Deleted

## 2019-01-16 DIAGNOSIS — I10 Essential (primary) hypertension: Secondary | ICD-10-CM

## 2019-01-16 DIAGNOSIS — R600 Localized edema: Secondary | ICD-10-CM

## 2019-01-16 MED ORDER — HYDROCHLOROTHIAZIDE 12.5 MG PO TABS: 12.5000 mg | ORAL_TABLET | Freq: Every day | ORAL | 0 refills | Status: DC

## 2019-01-16 NOTE — Telephone Encounter (Signed)
Please ask patient:  Has her swelling improved since we added the water pill to her lisinopril? If so then I'll stop the lisinopril and continue the HCTZ 12.5 mg. If not then we'll stop fluid pill (HCTZ).

## 2019-01-16 NOTE — Telephone Encounter (Signed)
Spoken and notified patient of Tawni Millers comments. Patient verbalized understanding.  Patient stated that swelling is better, not come but better. Please send the HCTZ 12.5 mg to [facility].

## 2019-01-16 NOTE — Telephone Encounter (Signed)
Noted. Discontinue lisinopril-HCTZ. Rx for HCTZ 12.5 mg sent to pharmacy. We will plan to see her on the 16th as scheduled.

## 2019-01-16 NOTE — Telephone Encounter (Signed)
Patient called stating that she was put on a blood pressure/water pill and she is concerned that her blood pressure has been too low. Patient stated that she has felt a little dizzy at times. Patient stated that she has not felt like she was going to pass out, just dizzy. BP reading 01/13/19 105/61 HR 75 01/14/19 111/65 HR 65 01/15/19 103/67 HR 83 01/16/19 108/60 HR 75 Today 117/67 HR 68

## 2019-01-26 ENCOUNTER — Ambulatory Visit (INDEPENDENT_AMBULATORY_CARE_PROVIDER_SITE_OTHER): Payer: Medicare Other | Admitting: Primary Care

## 2019-01-26 ENCOUNTER — Encounter: Payer: Self-pay | Admitting: Primary Care

## 2019-01-26 ENCOUNTER — Ambulatory Visit (INDEPENDENT_AMBULATORY_CARE_PROVIDER_SITE_OTHER): Payer: Medicare Other

## 2019-01-26 ENCOUNTER — Other Ambulatory Visit: Payer: Self-pay

## 2019-01-26 VITALS — BP 126/78 | HR 74 | Temp 97.5°F | Ht <= 58 in | Wt 183.5 lb

## 2019-01-26 DIAGNOSIS — R600 Localized edema: Secondary | ICD-10-CM

## 2019-01-26 DIAGNOSIS — I10 Essential (primary) hypertension: Secondary | ICD-10-CM | POA: Diagnosis not present

## 2019-01-26 DIAGNOSIS — R109 Unspecified abdominal pain: Secondary | ICD-10-CM | POA: Insufficient documentation

## 2019-01-26 LAB — COMPREHENSIVE METABOLIC PANEL
ALT: 22 U/L (ref 0–35)
AST: 20 U/L (ref 0–37)
Albumin: 4.3 g/dL (ref 3.5–5.2)
Alkaline Phosphatase: 67 U/L (ref 39–117)
BUN: 24 mg/dL — ABNORMAL HIGH (ref 6–23)
CO2: 30 mEq/L (ref 19–32)
Calcium: 9.6 mg/dL (ref 8.4–10.5)
Chloride: 102 mEq/L (ref 96–112)
Creatinine, Ser: 0.98 mg/dL (ref 0.40–1.20)
GFR: 54.02 mL/min — ABNORMAL LOW (ref >60.00)
Glucose, Bld: 127 mg/dL — ABNORMAL HIGH (ref 70–99)
Potassium: 3.8 mEq/L (ref 3.5–5.1)
Sodium: 142 mEq/L (ref 135–145)
Total Bilirubin: 0.5 mg/dL (ref 0.2–1.2)
Total Protein: 6.9 g/dL (ref 6.0–8.3)

## 2019-01-26 LAB — ECHOCARDIOGRAM COMPLETE
Height: 55.5 in
Weight: 2936 oz

## 2019-01-26 LAB — BRAIN NATRIURETIC PEPTIDE: Pro B Natriuretic peptide (BNP): 14 pg/mL (ref 0.0–100.0)

## 2019-01-26 MED ORDER — PERFLUTREN LIPID MICROSPHERE
1.0000 mL | INTRAVENOUS | Status: AC | PRN
  Administered 2019-01-26: 2 mL via INTRAVENOUS

## 2019-01-26 NOTE — Assessment & Plan Note (Signed)
Located to the right lateral trunk proximal to right hip for months.  Doubt abdominal cause.  Suspect more osteoarthritis, we did not have enough time to discuss this in great detail today.  Consider imaging of the right hip.  We will discuss this during her next visit.

## 2019-01-26 NOTE — Assessment & Plan Note (Signed)
No improvement with switch to HCTZ. Edema today appears stable and unchanged compared to last visit.  The main difference today is a 20 pound weight gain over the last 2 weeks.  Given this, coupled with her symptoms of exertional dyspnea, we will check a stat echocardiogram for evaluation of overall heart function.  Consider stopping HCTZ, adding furosemide 40 mg daily x5 days.  If we move forward with this plan we will plan to see her back in 1 week for follow-up and BMP.  Await pending BMP.  Await echocardiogram results.

## 2019-01-26 NOTE — Assessment & Plan Note (Signed)
Improved on HCTZ 12.5 mg alone. May need to discontinue and add furosemide given weight gain of 20 pounds with lower extremity edema.  BMP pending. Await echocardiogram results.

## 2019-01-26 NOTE — Patient Instructions (Addendum)
Stop by the lab prior to leaving today. I will notify you of your results once received.   Stop by the front desk and speak with either Rosaria Ferries regarding your echocardiogram.  It was a pleasure to see you today!

## 2019-01-26 NOTE — Progress Notes (Signed)
Subjective:    Patient ID: Alyssa Roy, female    DOB: Apr 15, 1933, 85 y.o.   MRN: [id number]  HPI  Alyssa Roy is an 85 year old female who presents today for follow up of hypertension and a chief complaint of side pain.  1) Hypertension: She was last evaluated on 01/04/19 and was noted to have elevated BP readings and complaints of bilateral lower extremity edema. She was already on lisinopril 20 mg. Lab work without obvious cause for edema so low dose HCTZ was added.   She called one week later reporting lower BP readings and dizziness so her lisinopril 20 mg was discontinued, HCTZ was continued.  Since her last visit she's gained nearly 20 pounds. She is not weighing herself at home. She has not noticed increase or decrease in edema to her lower extremities since initiation of HCTZ. She does have shortness of breath with mild exertion which is different. She's not urinating more since HCTZ was initiated.  She denies cough, chest pain.  Wt Readings from Last 3 Encounters:  01/26/19 183 lb 8 oz (83.2 kg)  01/04/19 162 lb (73.5 kg)  07/21/18 167 lb (75.8 kg)   2) Side Pain: Located to the right lateral upper side proximal to hip which has been present for the last several months. Pain is constant, worse with standing and moving in certain positions.  She thinks this may be arthritis.  History of cholecystectomy.  She denies abdominal pain, nausea, vomiting, diarrhea.  BP Readings from Last 3 Encounters:  01/26/19 126/78  01/04/19 (!) 144/84  07/21/18 132/90     Review of Systems  Constitutional: Negative for fever.  HENT: Negative for congestion.   Respiratory: Positive for shortness of breath. Negative for cough.   Cardiovascular: Positive for leg swelling. Negative for chest pain.  Gastrointestinal: Negative for abdominal pain, diarrhea and nausea.  Musculoskeletal: Positive for arthralgias.       Past Medical History:  Diagnosis Date  . Atherosclerosis   .  Chronic venous stasis   . GAD (generalized anxiety disorder)   . Hyperglycemia   . Hyperlipidemia   . Insomnia   . Osteoarthritis   . Urinary tract infection      Social History   Socioeconomic History  . Marital status: Unknown    Spouse name: Not on file  . Number of children: Not on file  . Years of education: Not on file  . Highest education level: Not on file  Occupational History  . Not on file  Social Needs  . Financial resource strain: Not on file  . Food insecurity    Worry: Not on file    Inability: Not on file  . Transportation needs    Medical: Not on file    Non-medical: Not on file  Tobacco Use  . Smoking status: Never Smoker  . Smokeless tobacco: Never Used  Substance and Sexual Activity  . Alcohol use: Never    Frequency: Never  . Drug use: Not on file  . Sexual activity: Not on file  Lifestyle  . Physical activity    Days per week: Not on file    Minutes per session: Not on file  . Stress: Not on file  Relationships  . Social Herbalist on phone: Not on file    Gets together: Not on file    Attends religious service: Not on file    Active member of club or organization: Not on file  Attends meetings of clubs or organizations: Not on file    Relationship status: Not on file  . Intimate partner violence    Fear of current or ex partner: Not on file    Emotionally abused: Not on file    Physically abused: Not on file    Forced sexual activity: Not on file  Other Topics Concern  . Not on file  Social History Narrative   Widow.   Retired.   Moved to [place] to live closer to daughter.    Past Surgical History:  Procedure Laterality Date  . ABDOMINAL HYSTERECTOMY  1979  . CHOLECYSTECTOMY  1984  . REPLACEMENT TOTAL KNEE Right 2007    Family History  Problem Relation Age of Onset  . Heart disease Mother   . Arthritis Mother   . Diabetes Father     Allergies  Allergen Reactions  . Crestor [Rosuvastatin Calcium]   .  Feldene [Piroxicam]   . Keflex [Cephalexin]   . Lipitor [Atorvastatin Calcium]   . Zetia [Ezetimibe]   . Aspirin Other (See Comments)    Stomach discomfort    Current Outpatient Medications on File Prior to Visit  Medication Sig Dispense Refill  . famotidine (PEPCID) 20 MG tablet Take 1 tablet (20 mg total) by mouth daily. For heartburn. 90 tablet 0  . hydrochlorothiazide (HYDRODIURIL) 12.5 MG tablet Take 1 tablet (12.5 mg total) by mouth daily. For blood pressure and swelling. 90 tablet 0  . predniSONE (DELTASONE) 1 MG tablet Take 3 mg by mouth daily with breakfast.      No current facility-administered medications on file prior to visit.     BP 126/78   Pulse 74   Temp (!) 97.5 F (36.4 C) (Temporal)   Ht 4' 7.5" (1.41 m)   Wt 183 lb 8 oz (83.2 kg)   SpO2 98%   BMI 41.88 kg/m    Objective:   Physical Exam  Constitutional: She appears well-nourished.  Cardiovascular: Normal rate and regular rhythm.  No murmur heard. Mild to moderate bilateral lower extremity edema from mid shin to foot.  More noticeable to the left side.  No worse than last visit.  Trace pitting.  Respiratory: Effort normal and breath sounds normal.  No crackles  GI: Soft. Bowel sounds are normal. There is no abdominal tenderness.  Skin: Skin is warm and dry.           Assessment & Plan:

## 2019-01-27 ENCOUNTER — Telehealth: Payer: Self-pay

## 2019-01-27 NOTE — Telephone Encounter (Signed)
Please notify patient that we will continue her on the hydrochlorothiazide as a different diuretic like furosemide will not be effective given her normal heart function.  She needs to work on elevating her legs and wearing compression hose.  If she cannot do this then we can send her to vascular to evaluate for lymphedema.

## 2019-01-27 NOTE — Telephone Encounter (Signed)
Pt left a message on triage line stating Alyssa Roy was supposed to have started her on a different fluid pill for fluid and blood pressure. Looks like furosemide possibly. Please advise

## 2019-01-28 ENCOUNTER — Other Ambulatory Visit: Payer: Self-pay | Admitting: Primary Care

## 2019-01-28 DIAGNOSIS — I10 Essential (primary) hypertension: Secondary | ICD-10-CM

## 2019-01-30 NOTE — Telephone Encounter (Signed)
Spoken and notified patient of [name]'s comments. Patient verbalized understanding.  

## 2019-01-31 DIAGNOSIS — H532 Diplopia: Secondary | ICD-10-CM | POA: Diagnosis not present

## 2019-01-31 DIAGNOSIS — H43393 Other vitreous opacities, bilateral: Secondary | ICD-10-CM | POA: Diagnosis not present

## 2019-01-31 DIAGNOSIS — H25813 Combined forms of age-related cataract, bilateral: Secondary | ICD-10-CM | POA: Diagnosis not present

## 2019-01-31 DIAGNOSIS — H527 Unspecified disorder of refraction: Secondary | ICD-10-CM | POA: Diagnosis not present

## 2019-02-01 DIAGNOSIS — Z7952 Long term (current) use of systemic steroids: Secondary | ICD-10-CM | POA: Diagnosis not present

## 2019-02-01 DIAGNOSIS — M353 Polymyalgia rheumatica: Secondary | ICD-10-CM | POA: Diagnosis not present

## 2019-02-03 ENCOUNTER — Telehealth: Payer: Self-pay | Admitting: *Deleted

## 2019-02-03 NOTE — Telephone Encounter (Signed)
Spoken and notified patient of [name]'s comments. Patient verbalized understanding.  

## 2019-02-03 NOTE — Telephone Encounter (Signed)
Message left for patient to return my call.  

## 2019-02-03 NOTE — Telephone Encounter (Signed)
Noted. Please thank patient for the information. Have her continue to monitor weight, especially as she's reducing her dose of prednisone.  Report reading averages in 2 weeks.

## 2019-02-03 NOTE — Telephone Encounter (Signed)
Patient called stating that she was suppose to call Allie Bossier NP and let her know what her RA doctor told her. Patient stated that her RA doctor told her that her blood work is stable and she is to cut back on her Prednisone pills from 3 a day to two a day for two weeks, then cut down to one a day. Patient stated that her weight has been fluctuating between 172 and 175. Patient stated that her weight today is 175 lbs.

## 2019-02-03 NOTE — Telephone Encounter (Signed)
Patient returned call from the office   C/B # [phone]

## 2019-02-04 ENCOUNTER — Other Ambulatory Visit: Payer: Self-pay | Admitting: Primary Care

## 2019-02-04 DIAGNOSIS — I10 Essential (primary) hypertension: Secondary | ICD-10-CM

## 2019-02-06 NOTE — Telephone Encounter (Signed)
How is her BP on the HCTZ? What are some numbers? Any weight changes? If BP is stable then we will continue HCTZ for now.

## 2019-02-06 NOTE — Telephone Encounter (Signed)
Patient called back and stated that she thought that she is supposed to start on a new BP medication. I did inform that she is supposed to continue the  Hydrochlorothiazide 12.5 mg for BP, is that correct?

## 2019-02-07 NOTE — Telephone Encounter (Signed)
Spoken to patient. Her BP this morning was 122/74 and it usually run about the same. She wight today and it was 176 lbs. She losing some weight which is what she trying anyway. She cut out some sweets.   Patient stated that she understand and will take HCTZ as instructed.

## 2019-02-07 NOTE — Telephone Encounter (Signed)
Noted  

## 2019-02-21 DIAGNOSIS — Z7952 Long term (current) use of systemic steroids: Secondary | ICD-10-CM | POA: Diagnosis not present

## 2019-02-21 DIAGNOSIS — M8589 Other specified disorders of bone density and structure, multiple sites: Secondary | ICD-10-CM | POA: Diagnosis not present

## 2019-02-21 DIAGNOSIS — M25473 Effusion, unspecified ankle: Secondary | ICD-10-CM | POA: Diagnosis not present

## 2019-02-21 DIAGNOSIS — I878 Other specified disorders of veins: Secondary | ICD-10-CM | POA: Diagnosis not present

## 2019-02-21 DIAGNOSIS — M353 Polymyalgia rheumatica: Secondary | ICD-10-CM | POA: Diagnosis not present

## 2019-02-22 ENCOUNTER — Telehealth: Payer: Self-pay | Admitting: *Deleted

## 2019-02-22 DIAGNOSIS — R600 Localized edema: Secondary | ICD-10-CM

## 2019-02-22 NOTE — Telephone Encounter (Signed)
Patient left a voicemail stating that she saw her rheumatoid doctor yesterday and her weight was 186 pounds. Patient stated that she wanted to let Anda Kraft know that her the scales here were right when her weight was 183 pounds. Patient stated that she is having edema primarily in the left ankle.  Patient stated that she can not wear stockings. Patient wants to know if she should she increase her fluid pill?  Patient stated that her rheumatoid doctor did some lab work and took a lot of her urine to test, but does not know what all she was testing for.

## 2019-02-23 NOTE — Telephone Encounter (Signed)
Please notify patient that we can send her to the vascular doctor to assess for lymphedema and blood flow to the legs. Please notify me if she is agreeable.  No need to increase the fluid pill given her normal heart function.

## 2019-02-24 ENCOUNTER — Encounter: Payer: Self-pay | Admitting: *Deleted

## 2019-02-24 NOTE — Telephone Encounter (Signed)
Mailed out for patient

## 2019-02-24 NOTE — Telephone Encounter (Signed)
Pt notified as instructed and voiced understanding and pt agrees to see vascular doctor (pt prefers doctor in [place] due to transportation); pt also request appt for mammogram at same time and place as seeing the vascular doctor. Dr Meda Coffee at [facility] increased the prednisone and pts family will pick up rx today. Pt not sure what prednisone was increased to. Pt wants to know results from the 01/26/19 in writing. Pt request lab results mailed to home address which was verified.

## 2019-02-24 NOTE — Telephone Encounter (Signed)
Noted, referral placed. Alyssa Roy, see note regarding lab results.

## 2019-03-07 ENCOUNTER — Other Ambulatory Visit: Payer: Self-pay | Admitting: Primary Care

## 2019-03-07 DIAGNOSIS — I10 Essential (primary) hypertension: Secondary | ICD-10-CM

## 2019-03-14 ENCOUNTER — Encounter (INDEPENDENT_AMBULATORY_CARE_PROVIDER_SITE_OTHER): Payer: Medicare Other | Admitting: Vascular Surgery

## 2019-04-03 ENCOUNTER — Other Ambulatory Visit: Payer: Self-pay | Admitting: Primary Care

## 2019-04-03 DIAGNOSIS — K219 Gastro-esophageal reflux disease without esophagitis: Secondary | ICD-10-CM

## 2019-04-04 ENCOUNTER — Other Ambulatory Visit: Payer: Self-pay

## 2019-04-04 ENCOUNTER — Encounter (INDEPENDENT_AMBULATORY_CARE_PROVIDER_SITE_OTHER): Payer: Self-pay | Admitting: Vascular Surgery

## 2019-04-04 ENCOUNTER — Encounter (INDEPENDENT_AMBULATORY_CARE_PROVIDER_SITE_OTHER): Payer: Self-pay

## 2019-04-04 ENCOUNTER — Ambulatory Visit (INDEPENDENT_AMBULATORY_CARE_PROVIDER_SITE_OTHER): Payer: Medicare Other | Admitting: Vascular Surgery

## 2019-04-04 VITALS — BP 141/94 | HR 96 | Resp 16 | Ht <= 58 in | Wt 183.4 lb

## 2019-04-04 DIAGNOSIS — I10 Essential (primary) hypertension: Secondary | ICD-10-CM | POA: Diagnosis not present

## 2019-04-04 DIAGNOSIS — R600 Localized edema: Secondary | ICD-10-CM

## 2019-04-04 DIAGNOSIS — M7989 Other specified soft tissue disorders: Secondary | ICD-10-CM | POA: Diagnosis not present

## 2019-04-04 DIAGNOSIS — E785 Hyperlipidemia, unspecified: Secondary | ICD-10-CM | POA: Diagnosis not present

## 2019-04-04 DIAGNOSIS — M150 Primary generalized (osteo)arthritis: Secondary | ICD-10-CM

## 2019-04-04 DIAGNOSIS — M159 Polyosteoarthritis, unspecified: Secondary | ICD-10-CM

## 2019-04-04 DIAGNOSIS — M8949 Other hypertrophic osteoarthropathy, multiple sites: Secondary | ICD-10-CM

## 2019-04-04 NOTE — Assessment & Plan Note (Signed)
blood pressure control important in reducing the progression of atherosclerotic disease. On appropriate oral medications.  

## 2019-04-04 NOTE — Patient Instructions (Signed)
Edema  Edema is when you have too much fluid in your body or under your skin. Edema may make your legs, feet, and ankles swell up. Swelling is also common in looser tissues, like around your eyes. This is a common condition. It gets more common as you get older. There are many possible causes of edema. Eating too much salt (sodium) and being on your feet or sitting for a long time can cause edema in your legs, feet, and ankles. Hot weather may make edema worse. Edema is usually painless. Your skin may look swollen or shiny. Follow these instructions at home:  Keep the swollen body part raised (elevated) above the level of your heart when you are sitting or lying down.  Do not sit still or stand for a long time.  Do not wear tight clothes. Do not wear garters on your upper legs.  Exercise your legs. This can help the swelling go down.  Wear elastic bandages or support stockings as told by your doctor.  Eat a low-salt (low-sodium) diet to reduce fluid as told by your doctor.  Depending on the cause of your swelling, you may need to limit how much fluid you drink (fluid restriction).  Take over-the-counter and prescription medicines only as told by your doctor. Contact a doctor if:  Treatment is not working.  You have heart, liver, or kidney disease and have symptoms of edema.  You have sudden and unexplained weight gain. Get help right away if:  You have shortness of breath or chest pain.  You cannot breathe when you lie down.  You have pain, redness, or warmth in the swollen areas.  You have heart, liver, or kidney disease and get edema all of a sudden.  You have a fever and your symptoms get worse all of a sudden. Summary  Edema is when you have too much fluid in your body or under your skin.  Edema may make your legs, feet, and ankles swell up. Swelling is also common in looser tissues, like around your eyes.  Raise (elevate) the swollen body part above the level of your  heart when you are sitting or lying down.  Follow your doctor's instructions about diet and how much fluid you can drink (fluid restriction). This information is not intended to replace advice given to you by your health care provider. Make sure you discuss any questions you have with your health care provider. Document Released: 12/16/2007 Document Revised: 07/02/2017 Document Reviewed: 07/17/2016 Elsevier Patient Education  2020 [employer]  

## 2019-04-04 NOTE — Assessment & Plan Note (Addendum)
lipid control important in reducing the progression of atherosclerotic disease.   

## 2019-04-04 NOTE — Assessment & Plan Note (Signed)
Her arthritis in the left knee is likely contributing to her lower extremity swelling.  She is also had a knee replacement on the right and changes postoperatively may be contributing some as well.

## 2019-04-04 NOTE — Progress Notes (Signed)
Patient ID: Alyssa Roy, female   DOB: Oct 24, 1932, 85 y.o.   MRN: [id number]  Chief Complaint  Patient presents with  . New Patient (Initial Visit)    ref Carlis Abbott for lle swelling    HPI Alyssa Roy is a 85 y.o. female.  I am asked to see the patient by K. Carlis Abbott, NP for evaluation of leg swelling.  This predominantly affects the left lower extremity although the right leg does have some swelling as well.  She has prominent varicosities bilaterally actually little worse on the right than the left and these are painful.  She reports no history of DVT or superficial thrombophlebitis to her knowledge.  She has had a knee replacement on the right and has severe arthritis on the left but has not had a knee replacement.  She has tried wearing compression stockings but her small stature and her large leg size has made these impossible to get on and off and actually they made things much worse.  She reports no trauma, injury, or clear inciting event that started the symptoms.  She does try to elevate her legs as much as possible and her activity is not as much as it was some years ago.     Past Medical History:  Diagnosis Date  . Atherosclerosis   . Chronic venous stasis   . GAD (generalized anxiety disorder)   . Hyperglycemia   . Hyperlipidemia   . Insomnia   . Osteoarthritis   . Urinary tract infection     Past Surgical History:  Procedure Laterality Date  . ABDOMINAL HYSTERECTOMY  1979  . CHOLECYSTECTOMY  1984  . REPLACEMENT TOTAL KNEE Right 2007    Family History Family History  Problem Relation Age of Onset  . Heart disease Mother   . Arthritis Mother   . Diabetes Father   No bleeding or clotting disorders  Social History Social History   Tobacco Use  . Smoking status: Never Smoker  . Smokeless tobacco: Never Used  Substance Use Topics  . Alcohol use: Never    Frequency: Never  . Drug use: Not on file  No drug use  Allergies  Allergen Reactions  .  Crestor [Rosuvastatin Calcium]   . Feldene [Piroxicam]   . Keflex [Cephalexin]   . Lipitor [Atorvastatin Calcium]   . Zetia [Ezetimibe]   . Aspirin Other (See Comments)    Stomach discomfort    Current Outpatient Medications  Medication Sig Dispense Refill  . acetaminophen (TYLENOL) 650 MG CR tablet Take 650 mg by mouth every 8 (eight) hours as needed for pain.    . Cholecalciferol (D-3-5) 125 MCG (5000 UT) capsule Take 5,000 Units by mouth daily.    Marland Kitchen co-enzyme Q-10 30 MG capsule Take 30 mg by mouth daily.    . Coenzyme Q10 (COQ10) 100 MG CAPS Take by mouth daily.    . Cyanocobalamin (B-12) 5000 MCG CAPS Take by mouth daily.    . famotidine (PEPCID) 20 MG tablet TAKE 1 TABLET (20 MG TOTAL) BY MOUTH DAILY. FOR HEARTBURN. 90 tablet 1  . hydrochlorothiazide (HYDRODIURIL) 12.5 MG tablet Take 1 tablet (12.5 mg total) by mouth daily. For blood pressure and swelling. 90 tablet 0  . predniSONE (DELTASONE) 1 MG tablet Take 3 mg by mouth daily with breakfast.      No current facility-administered medications for this visit.       REVIEW OF SYSTEMS (Negative unless checked)  Constitutional: [] Weight loss  [] Fever  [] Chills Cardiac: [] Chest  pain   [] Chest pressure   [] Palpitations   [] Shortness of breath when laying flat   [] Shortness of breath at rest   [] Shortness of breath with exertion. Vascular:  [] Pain in legs with walking   [] Pain in legs at rest   [] Pain in legs when laying flat   [] Claudication   [] Pain in feet when walking  [] Pain in feet at rest  [] Pain in feet when laying flat   [] History of DVT   [] Phlebitis   [x] Swelling in legs   [x] Varicose veins   [] Non-healing ulcers Pulmonary:   [] Uses home oxygen   [] Productive cough   [] Hemoptysis   [] Wheeze  [] COPD   [] Asthma Neurologic:  [] Dizziness  [] Blackouts   [] Seizures   [] History of stroke   [] History of TIA  [] Aphasia   [] Temporary blindness   [] Dysphagia   [] Weakness or numbness in arms   [] Weakness or numbness in legs  Musculoskeletal:  [x] Arthritis   [] Joint swelling   [x] Joint pain   [x] Low back pain Hematologic:  [] Easy bruising  [] Easy bleeding   [] Hypercoagulable state   [] Anemic  [] Hepatitis Gastrointestinal:  [] Blood in stool   [] Vomiting blood  [x] Gastroesophageal reflux/heartburn   [] Abdominal pain Genitourinary:  [] Chronic kidney disease   [] Difficult urination  [] Frequent urination  [] Burning with urination   [] Hematuria Skin:  [] Rashes   [] Ulcers   [] Wounds Psychological:  [x] History of anxiety   []  History of major depression.    Physical Exam BP (!) 141/94 (BP Location: Left Arm)   Pulse 96   Resp 16   Ht 4\' 7"  (1.397 m)   Wt 183 lb 6.4 oz (83.2 kg)   BMI 42.63 kg/m  Gen:  WD/WN, NAD, fairly short in stature.  Does appear younger than stated age Head: [place]/AT, No temporalis wasting.  Ear/Nose/Throat: Hearing grossly intact, nares w/o erythema or drainage, oropharynx w/o Erythema/Exudate Eyes: Conjunctiva clear, sclera non-icteric  Neck: trachea midline.  No JVD.  Pulmonary:  Good air movement, respirations not labored, no use of accessory muscles  Cardiac: RRR, no JVD Vascular:  Vessel Right Left  Radial Palpable Palpable                          DP  1+  trace  PT  trace  not palpable   Gastrointestinal:. No masses, surgical incisions, or scars. Musculoskeletal: M/S 5/5 throughout.  Extremities without ischemic changes.  No deformity or atrophy.  1+ right lower extremity edema, 2+ left lower extremity edema.  There is prominent swelling around the left knee and arthritic changes of the left knee.  Walks with a cane Neurologic: Sensation grossly intact in extremities.  Symmetrical.  Speech is fluent. Motor exam as listed above. Psychiatric: Judgment intact, Mood & affect appropriate for pt's clinical situation. Dermatologic: No rashes or ulcers noted.  No cellulitis or open wounds.    Radiology No results found.  Labs Recent Results (from the past 2160 hour(s))   Comprehensive metabolic panel     Status: Abnormal   Collection Time: 01/26/19 10:28 AM  Result Value Ref Range   Sodium 142 135 - 145 mEq/L   Potassium 3.8 3.5 - 5.1 mEq/L   Chloride 102 96 - 112 mEq/L   CO2 30 19 - 32 mEq/L   Glucose, Bld 127 (H) 70 - 99 mg/dL   BUN 24 (H) 6 - 23 mg/dL   Creatinine, Ser 0.98 0.40 - 1.20 mg/dL   Total Bilirubin 0.5 0.2 -  1.2 mg/dL   Alkaline Phosphatase 67 39 - 117 U/L   AST 20 0 - 37 U/L   ALT 22 0 - 35 U/L   Total Protein 6.9 6.0 - 8.3 g/dL   Albumin 4.3 3.5 - 5.2 g/dL   Calcium 9.6 8.4 - 10.5 mg/dL   GFR 54.02 (L) >60.00 mL/min  Brain natriuretic peptide     Status: None   Collection Time: 01/26/19 10:28 AM  Result Value Ref Range   Pro B Natriuretic peptide (BNP) 14.0 0.0 - 100.0 pg/mL  ECHOCARDIOGRAM COMPLETE     Status: None   Collection Time: 01/26/19  5:00 PM  Result Value Ref Range   Weight 2,936 oz   Height 55.5 in   BP 126/78 mmHg    Assessment/Plan:  Essential hypertension blood pressure control important in reducing the progression of atherosclerotic disease. On appropriate oral medications.   Hyperlipidemia lipid control important in reducing the progression of atherosclerotic disease.    Primary osteoarthritis involving multiple joints Her arthritis in the left knee is likely contributing to her lower extremity swelling.  She is also had a knee replacement on the right and changes postoperatively may be contributing some as well.  Swelling of limb I have had a long discussion with the patient regarding swelling and why it  causes symptoms.  Patient will begin wearing a compression garment and we will try the juxta lite compression system since she cannot tolerate typical compression stockings. The patient will  beginning wearing the compression garment first thing in the morning and removing them in the evening. The patient is instructed specifically not to sleep in the stockings.   In addition, behavioral modification  will be initiated.  This will include frequent elevation, use of over the counter pain medications and exercise such as walking.  I have reviewed systemic causes for chronic edema such as liver, kidney and cardiac etiologies.  The patient denies problems with these organ systems.    Consideration for a lymph pump will also be made based upon the effectiveness of conservative therapy.  This would help to improve the edema control and prevent sequela such as ulcers and infections.  Given her arthritic changes and the chronic swelling in the leg, she most likely does have a component of lymphedema from chronic scarring and lymphatic channels.  This would be stage II lymphedema.  Patient should undergo duplex ultrasound of the venous system to ensure that DVT or reflux is not present.  The patient will follow-up with me after the ultrasound.        Leotis Pain 04/04/2019, 2:54 PM   This note was created with Dragon medical transcription system.  Any errors from dictation are unintentional.

## 2019-04-04 NOTE — Assessment & Plan Note (Signed)
I have had a long discussion with the patient regarding swelling and why it  causes symptoms.  Patient will begin wearing a compression garment and we will try the juxta lite compression system since she cannot tolerate typical compression stockings. The patient will  beginning wearing the compression garment first thing in the morning and removing them in the evening. The patient is instructed specifically not to sleep in the stockings.   In addition, behavioral modification will be initiated.  This will include frequent elevation, use of over the counter pain medications and exercise such as walking.  I have reviewed systemic causes for chronic edema such as liver, kidney and cardiac etiologies.  The patient denies problems with these organ systems.    Consideration for a lymph pump will also be made based upon the effectiveness of conservative therapy.  This would help to improve the edema control and prevent sequela such as ulcers and infections.  Given her arthritic changes and the chronic swelling in the leg, she most likely does have a component of lymphedema from chronic scarring and lymphatic channels.  This would be stage II lymphedema.  Patient should undergo duplex ultrasound of the venous system to ensure that DVT or reflux is not present.  The patient will follow-up with me after the ultrasound.

## 2019-04-12 ENCOUNTER — Ambulatory Visit (INDEPENDENT_AMBULATORY_CARE_PROVIDER_SITE_OTHER): Payer: Medicare Other | Admitting: Nurse Practitioner

## 2019-04-12 ENCOUNTER — Other Ambulatory Visit: Payer: Self-pay | Admitting: Primary Care

## 2019-04-12 ENCOUNTER — Encounter (INDEPENDENT_AMBULATORY_CARE_PROVIDER_SITE_OTHER): Payer: Self-pay | Admitting: Nurse Practitioner

## 2019-04-12 ENCOUNTER — Ambulatory Visit (INDEPENDENT_AMBULATORY_CARE_PROVIDER_SITE_OTHER): Payer: Medicare Other

## 2019-04-12 ENCOUNTER — Other Ambulatory Visit: Payer: Self-pay

## 2019-04-12 VITALS — BP 131/85 | HR 81 | Resp 12 | Ht <= 58 in | Wt 182.0 lb

## 2019-04-12 DIAGNOSIS — I10 Essential (primary) hypertension: Secondary | ICD-10-CM

## 2019-04-12 DIAGNOSIS — M150 Primary generalized (osteo)arthritis: Secondary | ICD-10-CM | POA: Diagnosis not present

## 2019-04-12 DIAGNOSIS — I890 Lymphedema, not elsewhere classified: Secondary | ICD-10-CM | POA: Insufficient documentation

## 2019-04-12 DIAGNOSIS — Z23 Encounter for immunization: Secondary | ICD-10-CM | POA: Diagnosis not present

## 2019-04-12 DIAGNOSIS — R600 Localized edema: Secondary | ICD-10-CM

## 2019-04-12 DIAGNOSIS — M7989 Other specified soft tissue disorders: Secondary | ICD-10-CM

## 2019-04-12 DIAGNOSIS — M8949 Other hypertrophic osteoarthropathy, multiple sites: Secondary | ICD-10-CM

## 2019-04-12 DIAGNOSIS — K219 Gastro-esophageal reflux disease without esophagitis: Secondary | ICD-10-CM

## 2019-04-12 DIAGNOSIS — M159 Polyosteoarthritis, unspecified: Secondary | ICD-10-CM

## 2019-04-12 NOTE — Progress Notes (Addendum)
SUBJECTIVE:  Patient ID: Alyssa Roy, female    DOB: 1932/10/29, 85 y.o.   MRN: [id number] Chief Complaint  Patient presents with  . Follow-up    HPI  Alyssa Roy is a 85 y.o. female The patient returns to the office for followup evaluation regarding leg swelling.  The swelling has persisted and the pain associated with swelling continues. There have not been any interval development of a ulcerations or wounds.  For at least the last month,  the patient has been wearing graduated compression stockings and has noted little if any improvement in the lymphedema. The patient has been using compression routinely morning until night.  The patient also states elevation during the day and exercise is being done too.  Noninvasive studies show that the patient has no evidence of DVT, superficial venous thrombosis or chronic venous insufficiency bilaterally.  Past Medical History:  Diagnosis Date  . Atherosclerosis   . Chronic venous stasis   . GAD (generalized anxiety disorder)   . Hyperglycemia   . Hyperlipidemia   . Insomnia   . Osteoarthritis   . Urinary tract infection     Past Surgical History:  Procedure Laterality Date  . ABDOMINAL HYSTERECTOMY  1979  . CHOLECYSTECTOMY  1984  . REPLACEMENT TOTAL KNEE Right 2007    Social History   Socioeconomic History  . Marital status: Unknown    Spouse name: Not on file  . Number of children: Not on file  . Years of education: Not on file  . Highest education level: Not on file  Occupational History  . Not on file  Social Needs  . Financial resource strain: Not on file  . Food insecurity    Worry: Not on file    Inability: Not on file  . Transportation needs    Medical: Not on file    Non-medical: Not on file  Tobacco Use  . Smoking status: Never Smoker  . Smokeless tobacco: Never Used  Substance and Sexual Activity  . Alcohol use: Never    Frequency: Never  . Drug use: Not on file  . Sexual activity: Not  on file  Lifestyle  . Physical activity    Days per week: Not on file    Minutes per session: Not on file  . Stress: Not on file  Relationships  . Social Herbalist on phone: Not on file    Gets together: Not on file    Attends religious service: Not on file    Active member of club or organization: Not on file    Attends meetings of clubs or organizations: Not on file    Relationship status: Not on file  . Intimate partner violence    Fear of current or ex partner: Not on file    Emotionally abused: Not on file    Physically abused: Not on file    Forced sexual activity: Not on file  Other Topics Concern  . Not on file  Social History Narrative   Widow.   Retired.   Moved to [place] to live closer to daughter.    Family History  Problem Relation Age of Onset  . Heart disease Mother   . Arthritis Mother   . Diabetes Father     Allergies  Allergen Reactions  . Crestor [Rosuvastatin Calcium]   . Feldene [Piroxicam]   . Keflex [Cephalexin]   . Lipitor [Atorvastatin Calcium]   . Zetia [Ezetimibe]   . Aspirin Other (See Comments)  Stomach discomfort     Review of Systems   Review of Systems: Negative Unless Checked Constitutional: [] Weight loss  [] Fever  [] Chills Cardiac: [] Chest pain   []  Atrial Fibrillation  [] Palpitations   [] Shortness of breath when laying flat   [] Shortness of breath with exertion. [] Shortness of breath at rest Vascular:  [] Pain in legs with walking   [] Pain in legs with standing [] Pain in legs when laying flat   [] Claudication    [] Pain in feet when laying flat    [] History of DVT   [] Phlebitis   [x] Swelling in legs   [] Varicose veins   [] Non-healing ulcers Pulmonary:   [] Uses home oxygen   [] Productive cough   [] Hemoptysis   [] Wheeze  [] COPD   [] Asthma Neurologic:  [] Dizziness   [] Seizures  [] Blackouts [] History of stroke   [] History of TIA  [] Aphasia   [] Temporary Blindness   [] Weakness or numbness in arm   [] Weakness or numbness  in leg Musculoskeletal:   [] Joint swelling   [] Joint pain   [] Low back pain  []  History of Knee Replacement [x] Arthritis [] back Surgeries  []  Spinal Stenosis    Hematologic:  [] Easy bruising  [] Easy bleeding   [] Hypercoagulable state   [] Anemic Gastrointestinal:  [] Diarrhea   [] Vomiting  [] Gastroesophageal reflux/heartburn   [] Difficulty swallowing. [] Abdominal pain Genitourinary:  [] Chronic kidney disease   [] Difficult urination  [] Anuric   [] Blood in urine [] Frequent urination  [] Burning with urination   [] Hematuria Skin:  [x] Rashes   [] Ulcers [] Wounds Psychological:  [x] History of anxiety   [x]  History of major depression  []  Memory Difficulties      OBJECTIVE:   Physical Exam  BP 131/85 (BP Location: Left Arm, Patient Position: Sitting, Cuff Size: Normal)   Pulse 81   Resp 12   Ht 4' 4.25" (1.327 m)   Wt 182 lb (82.6 kg)   BMI 46.87 kg/m   Gen: WD/WN, NAD Head: [place]/AT, No temporalis wasting.  Ear/Nose/Throat: Hearing grossly intact, nares w/o erythema or drainage Eyes: PER, EOMI, sclera nonicteric.  Neck: Supple, no masses.  No JVD.  Pulmonary:  Good air movement, no use of accessory muscles.  Cardiac: RRR Vascular: 2+ edema bilaterally, scattered spider varicosities bilaterally. Vessel Right Left  Radial Palpable Palpable  Dorsalis Pedis Palpable Palpable  Posterior Tibial Palpable Palpable   Gastrointestinal: soft, non-distended. No guarding/no peritoneal signs.  Musculoskeletal: M/S 5/5 throughout.  No deformity or atrophy.  Neurologic: Pain and light touch intact in extremities.  Symmetrical.  Speech is fluent. Motor exam as listed above. Psychiatric: Judgment intact, Mood & affect appropriate for pt's clinical situation. Dermatologic: Bilateral stasis dermatitis.  No Ulcers Noted.  No changes consistent with cellulitis. Lymph : No Cervical lymphadenopathy, dermal thickening bilaterally      ASSESSMENT AND PLAN:  1. Lymphedema Recommend:  No surgery or  intervention at this point in time.    I have reviewed my previous discussion with the patient regarding swelling and why it causes symptoms.  Patient will continue wearing graduated compression stockings class 1 (20-30 mmHg) on a daily basis. The patient will  beginning wearing the stockings first thing in the morning and removing them in the evening. The patient is instructed specifically not to sleep in the stockings.    In addition, behavioral modification including several periods of elevation of the lower extremities during the day will be continued.  This was reviewed with the patient during the initial visit.  The patient will also continue routine exercise, especially walking on a  daily basis as was discussed during the initial visit.    Despite conservative treatments including graduated compression therapy class 1 and behavioral modification including exercise and elevation the patient  has not obtained adequate control of the lymphedema.  The patient still has stage 3 lymphedema and therefore, I believe that a lymph pump should be added to improve the control of the patient's lymphedema.  Additionally, a lymph pump is warranted because it will reduce the risk of cellulitis and ulceration in the future.  Patient should follow-up in six months    2. Essential hypertension Continue antihypertensive medications as already ordered, these medications have been reviewed and there are no changes at this time.   3. Gastroesophageal reflux disease, esophagitis presence not specified Continue PPI as already ordered, this medication has been reviewed and there are no changes at this time.  Avoidence of caffeine and alcohol  Moderate elevation of the head of the bed   4. Primary osteoarthritis involving multiple joints Continue NSAID medications as already ordered, these medications have been reviewed and there are no changes at this time.  Continued activity and therapy was stressed.     Current Outpatient Medications on File Prior to Visit  Medication Sig Dispense Refill  . acetaminophen (TYLENOL) 650 MG CR tablet Take 650 mg by mouth every 8 (eight) hours as needed for pain.    . Cholecalciferol (D-3-5) 125 MCG (5000 UT) capsule Take 5,000 Units by mouth daily.    Marland Kitchen co-enzyme Q-10 30 MG capsule Take 30 mg by mouth daily.    . Coenzyme Q10 (COQ10) 100 MG CAPS Take by mouth daily.    . Cyanocobalamin (B-12) 5000 MCG CAPS Take by mouth daily.    . famotidine (PEPCID) 20 MG tablet TAKE 1 TABLET (20 MG TOTAL) BY MOUTH DAILY. FOR HEARTBURN. 90 tablet 1  . hydrochlorothiazide (HYDRODIURIL) 12.5 MG tablet Take 1 tablet (12.5 mg total) by mouth daily. For blood pressure and swelling. 90 tablet 0  . predniSONE (DELTASONE) 1 MG tablet Take 3 mg by mouth daily with breakfast.      No current facility-administered medications on file prior to visit.     There are no Patient Instructions on file for this visit. No follow-ups on file.   Kris Hartmann, NP  This note was completed with Sales executive.  Any errors are purely unintentional.

## 2019-04-18 ENCOUNTER — Other Ambulatory Visit (INDEPENDENT_AMBULATORY_CARE_PROVIDER_SITE_OTHER): Payer: Self-pay | Admitting: Nurse Practitioner

## 2019-04-19 ENCOUNTER — Telehealth (INDEPENDENT_AMBULATORY_CARE_PROVIDER_SITE_OTHER): Payer: Self-pay

## 2019-04-19 NOTE — Telephone Encounter (Addendum)
Patient had called stating that she was giving a prescription for the compression stockings and a lymphedema pump order was also been placed for the left leg. The patient was asking if she needed to have an order for both legs not just for left leg. I advised her the order was placed for the left leg because that was the primary leg with swelling. The patient stated that she will call if she starts to have any concern with the right leg.

## 2019-05-02 DIAGNOSIS — Z7952 Long term (current) use of systemic steroids: Secondary | ICD-10-CM | POA: Diagnosis not present

## 2019-05-02 DIAGNOSIS — M353 Polymyalgia rheumatica: Secondary | ICD-10-CM | POA: Diagnosis not present

## 2019-06-07 DIAGNOSIS — Z1231 Encounter for screening mammogram for malignant neoplasm of breast: Secondary | ICD-10-CM | POA: Diagnosis not present

## 2019-06-12 DIAGNOSIS — M353 Polymyalgia rheumatica: Secondary | ICD-10-CM | POA: Diagnosis not present

## 2019-06-12 DIAGNOSIS — Z7952 Long term (current) use of systemic steroids: Secondary | ICD-10-CM | POA: Diagnosis not present

## 2019-07-11 ENCOUNTER — Telehealth: Payer: Self-pay | Admitting: Primary Care

## 2019-07-11 DIAGNOSIS — K219 Gastro-esophageal reflux disease without esophagitis: Secondary | ICD-10-CM

## 2019-07-11 DIAGNOSIS — I10 Essential (primary) hypertension: Secondary | ICD-10-CM

## 2019-07-11 DIAGNOSIS — R600 Localized edema: Secondary | ICD-10-CM

## 2019-07-11 MED ORDER — HYDROCHLOROTHIAZIDE 12.5 MG PO TABS: 12.5000 mg | ORAL_TABLET | Freq: Every day | ORAL | 1 refills | Status: DC

## 2019-07-11 MED ORDER — FAMOTIDINE 20 MG PO TABS: 20.0000 mg | ORAL_TABLET | Freq: Every day | ORAL | 1 refills | Status: DC

## 2019-07-11 NOTE — Telephone Encounter (Signed)
Patient called today She stated she tried to call in her prescription refill to the pharmacy and they stated the providers office would need to call this script in  [facility] [place]    The patient said she is also has psoriasis and would like to know what is recommended for his to put on this.

## 2019-07-11 NOTE — Telephone Encounter (Signed)
Please notify patient that I'm happy to evaluate her virtually to discuss. If she has a known diagnosis of psoriasis then OTC hydrocortisone cream can help. If no improvement then she needs a visit.

## 2019-07-11 NOTE — Telephone Encounter (Signed)
[facility] and was inform that Rx is ready for patient. I have called patient as well. However, patient insisted on refills because she does not believe the pharmacy. I did sent refills as requested.  Also patient wanted to ask about psoriasis. She stated she usually clear the area with Alyssa Roy and put some cream.  Is there anything OTC patient can use? She does not want to schedule appointment, she wants to limited her leaving the house as much as possible. Patient is aware Alyssa Roy is out of the office. Will wait.

## 2019-07-12 NOTE — Telephone Encounter (Signed)
Spoken and notified patient of [name]'s comments. Patient verbalized understanding.  

## 2019-07-20 DIAGNOSIS — M353 Polymyalgia rheumatica: Secondary | ICD-10-CM | POA: Diagnosis not present

## 2019-07-20 DIAGNOSIS — Z7952 Long term (current) use of systemic steroids: Secondary | ICD-10-CM | POA: Diagnosis not present

## 2019-07-26 ENCOUNTER — Telehealth: Payer: Self-pay | Admitting: *Deleted

## 2019-07-26 NOTE — Telephone Encounter (Signed)
Yes, I do recommend the Covid-19 vaccine.

## 2019-07-26 NOTE — Telephone Encounter (Signed)
Patient called wanting to know where to call to schedule an appointment for the covid vaccine. Telephone number was given to patient. Patient wanted to know if Alyssa Bossier NP recommends that she get the vaccine?

## 2019-07-26 NOTE — Telephone Encounter (Signed)
Spoken and notified patient of Alyssa Roy comments. Patient verbalized understanding.  However, patient had another question.  Chronic back pain. She was using Tylenol but that is not helping that much. She know she should not take tramadol so patient wanted to know is there anything else she can take?

## 2019-07-27 NOTE — Telephone Encounter (Signed)
Make sure she's maximizing Tylenol. I recommend Tylenol Athritis and she can take 1000 mg every 8 hours daily. This is most effective when taken routinely daily.  She could add in some Aleve or 400 mg of ibuporfen in between Tylenol doses. Make sure she's walking daily to prevent stiffness.

## 2019-07-27 NOTE — Telephone Encounter (Signed)
I called and advised patient on below. She aid that she would try taking the tylenol on a more regular basis & she does takwe the tylenol arthritis. She stated that she could not however take the aleve or ibuprofen because it aggravated her stomach.

## 2019-07-28 NOTE — Telephone Encounter (Signed)
Noted  

## 2019-08-24 DIAGNOSIS — M8949 Other hypertrophic osteoarthropathy, multiple sites: Secondary | ICD-10-CM | POA: Diagnosis not present

## 2019-08-24 DIAGNOSIS — M353 Polymyalgia rheumatica: Secondary | ICD-10-CM | POA: Diagnosis not present

## 2019-08-24 DIAGNOSIS — I878 Other specified disorders of veins: Secondary | ICD-10-CM | POA: Diagnosis not present

## 2019-08-29 DIAGNOSIS — L304 Erythema intertrigo: Secondary | ICD-10-CM | POA: Diagnosis not present

## 2019-08-29 DIAGNOSIS — D225 Melanocytic nevi of trunk: Secondary | ICD-10-CM | POA: Diagnosis not present

## 2019-08-29 DIAGNOSIS — L821 Other seborrheic keratosis: Secondary | ICD-10-CM | POA: Diagnosis not present

## 2019-08-29 DIAGNOSIS — Z85828 Personal history of other malignant neoplasm of skin: Secondary | ICD-10-CM | POA: Diagnosis not present

## 2019-08-29 DIAGNOSIS — L309 Dermatitis, unspecified: Secondary | ICD-10-CM | POA: Diagnosis not present

## 2019-08-29 DIAGNOSIS — D2272 Melanocytic nevi of left lower limb, including hip: Secondary | ICD-10-CM | POA: Diagnosis not present

## 2019-10-04 DIAGNOSIS — Z7952 Long term (current) use of systemic steroids: Secondary | ICD-10-CM | POA: Diagnosis not present

## 2019-10-04 DIAGNOSIS — M353 Polymyalgia rheumatica: Secondary | ICD-10-CM | POA: Diagnosis not present

## 2019-10-10 ENCOUNTER — Ambulatory Visit (INDEPENDENT_AMBULATORY_CARE_PROVIDER_SITE_OTHER): Payer: Medicare Other | Admitting: Vascular Surgery

## 2019-10-10 ENCOUNTER — Other Ambulatory Visit: Payer: Self-pay

## 2019-10-10 ENCOUNTER — Encounter (INDEPENDENT_AMBULATORY_CARE_PROVIDER_SITE_OTHER): Payer: Self-pay | Admitting: Vascular Surgery

## 2019-10-10 VITALS — BP 155/83 | HR 73 | Resp 16 | Ht 59.0 in | Wt 179.0 lb

## 2019-10-10 DIAGNOSIS — M8949 Other hypertrophic osteoarthropathy, multiple sites: Secondary | ICD-10-CM | POA: Diagnosis not present

## 2019-10-10 DIAGNOSIS — I10 Essential (primary) hypertension: Secondary | ICD-10-CM

## 2019-10-10 DIAGNOSIS — I890 Lymphedema, not elsewhere classified: Secondary | ICD-10-CM | POA: Diagnosis not present

## 2019-10-10 DIAGNOSIS — E785 Hyperlipidemia, unspecified: Secondary | ICD-10-CM

## 2019-10-10 DIAGNOSIS — M159 Polyosteoarthritis, unspecified: Secondary | ICD-10-CM

## 2019-10-10 NOTE — Patient Instructions (Signed)

## 2019-10-10 NOTE — Progress Notes (Signed)
MRN : [id number]  Alyssa Roy is a 86 y.o. (03-30-1933) female who presents with chief complaint of  Chief Complaint  Patient presents with  . Follow-up    6 month f/u studies  .  History of Present Illness: Patient returns today in follow up of her lymphedema and leg swelling.  Her legs look better today with the regular use of the lymphedema pump and the Velcro compression system.  She does still retain some fluid around the ankle and the foot just below where the Velcro system straps in.  The left leg is more severely affected the 2 legs.  No ulceration or weeping of the legs.  No fevers or chills  Current Outpatient Medications  Medication Sig Dispense Refill  . acetaminophen (TYLENOL) 650 MG CR tablet Take 650 mg by mouth every 8 (eight) hours as needed for pain.    . Cholecalciferol (D-3-5) 125 MCG (5000 UT) capsule Take 5,000 Units by mouth daily.    Marland Kitchen co-enzyme Q-10 30 MG capsule Take 30 mg by mouth daily.    . Coenzyme Q10 (COQ10) 100 MG CAPS Take by mouth daily.    . Cyanocobalamin (B-12) 5000 MCG CAPS Take by mouth daily.    . famotidine (PEPCID) 20 MG tablet Take 1 tablet (20 mg total) by mouth daily. For heartburn. 90 tablet 1  . hydrochlorothiazide (HYDRODIURIL) 12.5 MG tablet Take 1 tablet (12.5 mg total) by mouth daily. For blood pressure and swelling. 90 tablet 1  . predniSONE (DELTASONE) 1 MG tablet Take 3 mg by mouth daily with breakfast.      No current facility-administered medications for this visit.    Past Medical History:  Diagnosis Date  . Atherosclerosis   . Chronic venous stasis   . GAD (generalized anxiety disorder)   . Hyperglycemia   . Hyperlipidemia   . Insomnia   . Osteoarthritis   . Urinary tract infection     Past Surgical History:  Procedure Laterality Date  . ABDOMINAL HYSTERECTOMY  1979  . CHOLECYSTECTOMY  1984  . REPLACEMENT TOTAL KNEE Right 2007     Social History   Tobacco Use  . Smoking status: Never Smoker  .  Smokeless tobacco: Never Used  Substance Use Topics  . Alcohol use: Never  . Drug use: Not on file    Family History  Problem Relation Age of Onset  . Heart disease Mother   . Arthritis Mother   . Diabetes Father      Allergies  Allergen Reactions  . Crestor [Rosuvastatin Calcium]   . Feldene [Piroxicam]   . Keflex [Cephalexin]   . Lipitor [Atorvastatin Calcium]   . Zetia [Ezetimibe]   . Aspirin Other (See Comments)    Stomach discomfort     REVIEW OF SYSTEMS (Negative unless checked) Constitutional: [] ?Weight loss[] ?Fever[] ?Chills Cardiac:[] ?Chest pain[] ? Atrial Fibrillation[] ?Palpitations [] ?Shortness of breath when laying flat [] ?Shortness of breath with exertion. [] ?Shortness of breath at rest Vascular: [] ?Pain in legs with walking[] ?Pain in legswith standing[] ?Pain in legs when laying flat [] ?Claudication  [] ?Pain in feet when laying flat [] ?History of DVT [] ?Phlebitis [x] ?Swelling in legs [] ?Varicose veins [] ?Non-healing ulcers Pulmonary: [] ?Uses home oxygen [] ?Productive cough[] ?Hemoptysis [] ?Wheeze [] ?COPD [] ?Asthma Neurologic: [] ?Dizziness[] ?Seizures [] ?Blackouts[] ?History of stroke [] ?History of TIA[] ?Aphasia [] ?Temporary Blindness[] ?Weaknessor numbness in arm [] ?Weakness or numbnessin leg Musculoskeletal:[] ?Joint swelling [] ?Joint pain [] ?Low back pain  [] ? History of Knee Replacement [x] ?Arthritis [] ?back Surgeries[] ? Spinal Stenosis  Hematologic:[] ?Easy bruising[] ?Easy bleeding [] ?Hypercoagulable state [] ?Anemic Gastrointestinal:[] ?Diarrhea [] ?Vomiting[] ?Gastroesophageal reflux/heartburn[] ?Difficulty swallowing. [] ?Abdominal pain Genitourinary: [] ?Chronic kidney disease [] ?Difficulturination [] ?Anuric[] ?Blood  in urine [] ?Frequenturination [] ?Burning with urination[] ?Hematuria Skin: [x] ?Rashes [] ?Ulcers [] ?Wounds Psychological: [x] ?History of  anxiety[x] ?History of major depression  [] ? Memory Difficulties   Physical Examination  BP (!) 155/83 (BP Location: Right Arm)   Pulse 73   Resp 16   Ht 4\' 11"  (1.499 m)   Wt 179 lb (81.2 kg)   BMI 36.15 kg/m  Gen:  WD/WN, NAD Head: [place]/AT, No temporalis wasting. Ear/Nose/Throat: Hearing grossly intact, nares w/o erythema or drainage Eyes: Conjunctiva clear. Sclera non-icteric Neck: Supple.  Trachea midline Pulmonary:  Good air movement, no use of accessory muscles.  Cardiac: RRR, no JVD Vascular:  Vessel Right Left  Radial Palpable Palpable                   Musculoskeletal: M/S 5/5 throughout.  No deformity or atrophy.  Trace right lower extremity edema, 1+ left lower extremity edema. Neurologic: Sensation grossly intact in extremities.  Symmetrical.  Speech is fluent.  Psychiatric: Judgment intact, Mood & affect appropriate for pt's clinical situation. Dermatologic: No rashes or ulcers noted.  No cellulitis or open wounds.       Labs No results found for this or any previous visit (from the past 2160 hour(s)).  Radiology No results found.  Assessment/Plan Essential hypertension blood pressure control important in reducing the progression of atherosclerotic disease. On appropriate oral medications.   Hyperlipidemia lipid control important in reducing the progression of atherosclerotic disease.    Primary osteoarthritis involving multiple joints Her arthritis in the left knee is likely contributing to her lower extremity swelling.  She is also had a knee replacement on the right and changes postoperatively may be contributing some as well.  Lymphedema The patient has stable lymphedema with symptoms that are improved with the regular use of the lymphedema pump and her compression system.  She is going to try typical compression stockings and move away from the Velcro system in the near future.  She will continue to elevate her legs and be as active as  possible.  I plan to see her back in 6 months.    Leotis Pain, MD  10/10/2019 12:59 PM    This note was created with Dragon medical transcription system.  Any errors from dictation are purely unintentional

## 2019-10-10 NOTE — Assessment & Plan Note (Signed)
The patient has stable lymphedema with symptoms that are improved with the regular use of the lymphedema pump and her compression system.  She is going to try typical compression stockings and move away from the Velcro system in the near future.  She will continue to elevate her legs and be as active as possible.  I plan to see her back in 6 months.

## 2019-11-02 ENCOUNTER — Ambulatory Visit (INDEPENDENT_AMBULATORY_CARE_PROVIDER_SITE_OTHER): Payer: Medicare Other

## 2019-11-02 DIAGNOSIS — Z0000 Encounter for general adult medical examination without abnormal findings: Secondary | ICD-10-CM

## 2019-11-02 NOTE — Patient Instructions (Signed)
Alyssa Roy , Thank you for taking time to come for your Medicare Wellness Visit. I appreciate your ongoing commitment to your health goals. Please review the following plan we discussed and let me know if I can assist you in the future.   Screening recommendations/referrals: Colonoscopy: Up to date, completed 07/14/2015 Mammogram: Up to date, completed 06/07/2019 Bone Density: Patient will bring records Recommended yearly ophthalmology/optometry visit for glaucoma screening and checkup Recommended yearly dental visit for hygiene and checkup  Vaccinations: Influenza vaccine: Up to date, completed 04/12/2019 Pneumococcal vaccine: Completed series Tdap vaccine: Up to date, completed 07/11/2012 Shingles vaccine: administered 04/12/2018    Advanced directives: Please bring a copy of your POA (Power of [place]) and/or Living Will to your next appointment.  Conditions/risks identified: hypertension, hyperlipidemia  Next appointment: 11/15/2019 @ 11 am    Preventive Care 65 Years and Older, Female Preventive care refers to lifestyle choices and visits with your health care provider that can promote health and wellness. What does preventive care include?  A yearly physical exam. This is also called an annual well check.  Dental exams once or twice a year.  Routine eye exams. Ask your health care provider how often you should have your eyes checked.  Personal lifestyle choices, including:  Daily care of your teeth and gums.  Regular physical activity.  Eating a healthy diet.  Avoiding tobacco and drug use.  Limiting alcohol use.  Practicing safe sex.  Taking low-dose aspirin every day.  Taking vitamin and mineral supplements as recommended by your health care provider. What happens during an annual well check? The services and screenings done by your health care provider during your annual well check will depend on your age, overall health, lifestyle risk factors, and family  history of disease. Counseling  Your health care provider may ask you questions about your:  Alcohol use.  Tobacco use.  Drug use.  Emotional well-being.  Home and relationship well-being.  Sexual activity.  Eating habits.  History of falls.  Memory and ability to understand (cognition).  Work and work Statistician.  Reproductive health. Screening  You may have the following tests or measurements:  Height, weight, and BMI.  Blood pressure.  Lipid and cholesterol levels. These may be checked every 5 years, or more frequently if you are over 74 years old.  Skin check.  Lung cancer screening. You may have this screening every year starting at age 4 if you have a 30-pack-year history of smoking and currently smoke or have quit within the past 15 years.  Fecal occult blood test (FOBT) of the stool. You may have this test every year starting at age 91.  Flexible sigmoidoscopy or colonoscopy. You may have a sigmoidoscopy every 5 years or a colonoscopy every 10 years starting at age 89.  Hepatitis C blood test.  Hepatitis B blood test.  Sexually transmitted disease (STD) testing.  Diabetes screening. This is done by checking your blood sugar (glucose) after you have not eaten for a while (fasting). You may have this done every 1-3 years.  Bone density scan. This is done to screen for osteoporosis. You may have this done starting at age 37.  Mammogram. This may be done every 1-2 years. Talk to your health care provider about how often you should have regular mammograms. Talk with your health care provider about your test results, treatment options, and if necessary, the need for more tests. Vaccines  Your health care provider may recommend certain vaccines, such as:  Influenza vaccine. This is recommended every year.  Tetanus, diphtheria, and acellular pertussis (Tdap, Td) vaccine. You may need a Td booster every 10 years.  Zoster vaccine. You may need this after  age 76.  Pneumococcal 13-valent conjugate (PCV13) vaccine. One dose is recommended after age 1.  Pneumococcal polysaccharide (PPSV23) vaccine. One dose is recommended after age 64. Talk to your health care provider about which screenings and vaccines you need and how often you need them. This information is not intended to replace advice given to you by your health care provider. Make sure you discuss any questions you have with your health care provider. Document Released: 07/26/2015 Document Revised: 03/18/2016 Document Reviewed: 04/30/2015 Elsevier Interactive Patient Education  2017 [employer] Prevention in the Home Falls can cause injuries. They can happen to people of all ages. There are many things you can do to make your home safe and to help prevent falls. What can I do on the outside of my home?  Regularly fix the edges of walkways and driveways and fix any cracks.  Remove anything that might make you trip as you walk through a door, such as a raised step or threshold.  Trim any bushes or trees on the path to your home.  Use bright outdoor lighting.  Clear any walking paths of anything that might make someone trip, such as rocks or tools.  Regularly check to see if handrails are loose or broken. Make sure that both sides of any steps have handrails.  Any raised decks and porches should have guardrails on the edges.  Have any leaves, snow, or ice cleared regularly.  Use sand or salt on walking paths during winter.  Clean up any spills in your garage right away. This includes oil or grease spills. What can I do in the bathroom?  Use night lights.  Install grab bars by the toilet and in the tub and shower. Do not use towel bars as grab bars.  Use non-skid mats or decals in the tub or shower.  If you need to sit down in the shower, use a plastic, non-slip stool.  Keep the floor dry. Clean up any water that spills on the floor as soon as it  happens.  Remove soap buildup in the tub or shower regularly.  Attach bath mats securely with double-sided non-slip rug tape.  Do not have throw rugs and other things on the floor that can make you trip. What can I do in the bedroom?  Use night lights.  Make sure that you have a light by your bed that is easy to reach.  Do not use any sheets or blankets that are too big for your bed. They should not hang down onto the floor.  Have a firm chair that has side arms. You can use this for support while you get dressed.  Do not have throw rugs and other things on the floor that can make you trip. What can I do in the kitchen?  Clean up any spills right away.  Avoid walking on wet floors.  Keep items that you use a lot in easy-to-reach places.  If you need to reach something above you, use a strong step stool that has a grab bar.  Keep electrical cords out of the way.  Do not use floor polish or wax that makes floors slippery. If you must use wax, use non-skid floor wax.  Do not have throw rugs and other things on the floor that  can make you trip. What can I do with my stairs?  Do not leave any items on the stairs.  Make sure that there are handrails on both sides of the stairs and use them. Fix handrails that are broken or loose. Make sure that handrails are as long as the stairways.  Check any carpeting to make sure that it is firmly attached to the stairs. Fix any carpet that is loose or worn.  Avoid having throw rugs at the top or bottom of the stairs. If you do have throw rugs, attach them to the floor with carpet tape.  Make sure that you have a light switch at the top of the stairs and the bottom of the stairs. If you do not have them, ask someone to add them for you. What else can I do to help prevent falls?  Wear shoes that:  Do not have high heels.  Have rubber bottoms.  Are comfortable and fit you well.  Are closed at the toe. Do not wear sandals.  If you  use a stepladder:  Make sure that it is fully opened. Do not climb a closed stepladder.  Make sure that both sides of the stepladder are locked into place.  Ask someone to hold it for you, if possible.  Clearly mark and make sure that you can see:  Any grab bars or handrails.  First and last steps.  Where the edge of each step is.  Use tools that help you move around (mobility aids) if they are needed. These include:  Canes.  Walkers.  Scooters.  Crutches.  Turn on the lights when you go into a dark area. Replace any light bulbs as soon as they burn out.  Set up your furniture so you have a clear path. Avoid moving your furniture around.  If any of your floors are uneven, fix them.  If there are any pets around you, be aware of where they are.  Review your medicines with your doctor. Some medicines can make you feel dizzy. This can increase your chance of falling. Ask your doctor what other things that you can do to help prevent falls. This information is not intended to replace advice given to you by your health care provider. Make sure you discuss any questions you have with your health care provider. Document Released: 04/25/2009 Document Revised: 12/05/2015 Document Reviewed: 08/03/2014 Elsevier Interactive Patient Education  2017 [employer].

## 2019-11-02 NOTE — Progress Notes (Signed)
oo   Subjective:   Alyssa Roy is a 86 y.o. female who presents for Medicare Annual (Subsequent) preventive examination.  Review of Systems: N/A   This visit is being conducted through telemedicine via telephone at the nurse health advisor's home address due to the COVID-19 pandemic. This patient has given me verbal consent via doximity to conduct this visit, patient states they are participating from their home address. Patient and myself are on the telephone call. There is no referral for this visit. Some vital signs may be absent or patient reported.    Patient identification: identified by name, DOB, and current address   Cardiac Risk Factors include: advanced age (>13men, >48 women);hypertension;dyslipidemia     Objective:     Vitals: There were no vitals taken for this visit.  There is no height or weight on file to calculate BMI.  Advanced Directives 11/02/2019 04/23/2018  Does Patient Have a Medical Advance Directive? Yes Yes  Type of Paramedic of [place];Living will [place];Living will  Does patient want to make changes to medical advance directive? - No - Patient declined  Copy of [place] in Chart? No - copy requested No - copy requested    Tobacco Social History   Tobacco Use  Smoking Status Never Smoker  Smokeless Tobacco Never Used     Counseling given: Not Answered   Clinical Intake:  Pre-visit preparation completed: Yes  Pain : 0-10 Pain Score: 5  Pain Type: Chronic pain Pain Location: Rib cage Pain Descriptors / Indicators: Aching Pain Onset: More than a month ago Pain Frequency: Intermittent     Nutritional Risks: None Diabetes: No  How often do you need to have someone help you when you read instructions, pamphlets, or other written materials from your doctor or pharmacy?: 1 - Never What is the last grade level you completed in school?: 12th  Interpreter Needed?:  No  Information entered by :: CJohnson, LPN  Past Medical History:  Diagnosis Date  . Atherosclerosis   . Chronic venous stasis   . GAD (generalized anxiety disorder)   . Hyperglycemia   . Hyperlipidemia   . Insomnia   . Osteoarthritis   . Urinary tract infection    Past Surgical History:  Procedure Laterality Date  . ABDOMINAL HYSTERECTOMY  1979  . CHOLECYSTECTOMY  1984  . REPLACEMENT TOTAL KNEE Right 2007   Family History  Problem Relation Age of Onset  . Heart disease Mother   . Arthritis Mother   . Diabetes Father    Social History   Socioeconomic History  . Marital status: Unknown    Spouse name: Not on file  . Number of children: Not on file  . Years of education: Not on file  . Highest education level: Not on file  Occupational History  . Not on file  Tobacco Use  . Smoking status: Never Smoker  . Smokeless tobacco: Never Used  Substance and Sexual Activity  . Alcohol use: Not Currently  . Drug use: Not on file  . Sexual activity: Not on file  Other Topics Concern  . Not on file  Social History Narrative   Widow.   Retired.   Moved to [place] to live closer to daughter.   Social Determinants of Health   Financial Resource Strain: Low Risk   . Difficulty of Paying Living Expenses: Not hard at all  Food Insecurity: No Food Insecurity  . Worried About Charity fundraiser in  the Last Year: Never true  . Ran Out of Food in the Last Year: Never true  Transportation Needs: No Transportation Needs  . Lack of Transportation (Medical): No  . Lack of Transportation (Non-Medical): No  Physical Activity: Inactive  . Days of Exercise per Week: 0 days  . Minutes of Exercise per Session: 0 min  Stress: No Stress Concern Present  . Feeling of Stress : Not at all  Social Connections:   . Frequency of Communication with Friends and Family:   . Frequency of Social Gatherings with Friends and Family:   . Attends Religious Services:   . Active Member of Clubs  or Organizations:   . Attends Archivist Meetings:   Marland Kitchen Marital Status:     Outpatient Encounter Medications as of 11/02/2019  Medication Sig  . acetaminophen (TYLENOL) 650 MG CR tablet Take 650 mg by mouth every 8 (eight) hours as needed for pain.  . Cholecalciferol (D-3-5) 125 MCG (5000 UT) capsule Take 5,000 Units by mouth daily.  Marland Kitchen co-enzyme Q-10 30 MG capsule Take 30 mg by mouth daily.  . Coenzyme Q10 (COQ10) 100 MG CAPS Take by mouth daily.  . Cyanocobalamin (B-12) 5000 MCG CAPS Take by mouth daily.  . famotidine (PEPCID) 20 MG tablet Take 1 tablet (20 mg total) by mouth daily. For heartburn.  . hydrochlorothiazide (HYDRODIURIL) 12.5 MG tablet Take 1 tablet (12.5 mg total) by mouth daily. For blood pressure and swelling.  . predniSONE (DELTASONE) 1 MG tablet Take 3 mg by mouth daily with breakfast.    No facility-administered encounter medications on file as of 11/02/2019.    Activities of Daily Living In your present state of health, do you have any difficulty performing the following activities: 11/02/2019  Hearing? Y  Comment wears hearing aids  Vision? N  Difficulty concentrating or making decisions? N  Walking or climbing stairs? N  Dressing or bathing? N  Doing errands, shopping? N  Preparing Food and eating ? N  Using the Toilet? N  In the past six months, have you accidently leaked urine? N  Do you have problems with loss of bowel control? N  Managing your Medications? N  Managing your Finances? N  Housekeeping or managing your Housekeeping? N  Some recent data might be hidden    Patient Care Team: Pleas Koch, NP as PCP - General (Internal Medicine)    Assessment:   This is a routine wellness examination for [place].  Exercise Activities and Dietary recommendations Current Exercise Habits: The patient does not participate in regular exercise at present, Exercise limited by: None identified  Goals    . Patient Stated     11/02/2019, I will  maintain and continue medications as prescribed.        Fall Risk Fall Risk  11/02/2019  Falls in the past year? 0  Number falls in past yr: 0  Injury with Fall? 0  Risk for fall due to : Medication side effect  Follow up Falls evaluation completed;Falls prevention discussed   Is the patient's home free of loose throw rugs in walkways, pet beds, electrical cords, etc?   yes      Grab bars in the bathroom? yes      Handrails on the stairs?   yes      Adequate lighting?   yes  Timed Get Up and Go performed: N/A  Depression Screen PHQ 2/9 Scores 11/02/2019  PHQ - 2 Score 0  PHQ- 9 Score 0  Cognitive Function MMSE - Mini Mental State Exam 11/02/2019  Orientation to time 5  Orientation to Place 5  Registration 3  Attention/ Calculation 5  Recall 3  Language- repeat 1       Mini Cog  Mini-Cog screen was completed. Maximum score is 22. A value of 0 denotes this part of the MMSE was not completed or the patient failed this part of the Mini-Cog screening.  Immunization History  Administered Date(s) Administered  . Influenza, High Dose Seasonal PF 04/12/2018, 04/12/2019  . Influenza-Unspecified 04/01/2017  . Pneumococcal Conjugate-13 09/12/2015  . Pneumococcal Polysaccharide-23 07/14/1999, 09/28/2017  . Tdap 07/11/2012  . Unspecified SARS-COV-2 Vaccination 09/04/2019, 09/25/2019  . Zoster 07/22/2009  . Zoster Recombinat (Shingrix) 04/12/2018    Qualifies for Shingles Vaccine: Yes   Screening Tests Health Maintenance  Topic Date Due  . DEXA SCAN  Never done  . INFLUENZA VACCINE  02/11/2020  . TETANUS/TDAP  07/11/2022  . COVID-19 Vaccine  Completed  . PNA vac Low Risk Adult  Completed    Cancer Screenings: Lung: Low Dose CT Chest recommended if Age 52-80 years, 30 pack-year currently smoking OR have quit w/in 15 years. Patient does not qualify. Breast:  Up to date on Mammogram: Yes, completed 06/07/2019   Up to date of Bone Density/Dexa: Patient will bring her  records Colorectal: completed 07/14/2015  Additional Screenings:  Hepatitis C Screening: N/A     Plan:    Patient will maintain and continue medications as prescribed.    I have personally reviewed and noted the following in the patient's chart:   . Medical and social history . Use of alcohol, tobacco or illicit drugs  . Current medications and supplements . Functional ability and status . Nutritional status . Physical activity . Advanced directives . List of other physicians . Hospitalizations, surgeries, and ER visits in previous 12 months . Vitals . Screenings to include cognitive, depression, and falls . Referrals and appointments  In addition, I have reviewed and discussed with patient certain preventive protocols, quality metrics, and best practice recommendations. A written personalized care plan for preventive services as well as general preventive health recommendations were provided to patient.     Andrez Grime, LPN  [phone]

## 2019-11-02 NOTE — Progress Notes (Signed)
PCP notes:  Health Maintenance: Dexa- Patient will bring her records   Abnormal Screenings: none   Patient concerns: Wants urine specimen done- complains of urinary frequency  Wants a blood test done to check for melanoma  Discuss her ongoing shortness of breath    Nurse concerns: none   Next PCP appt.: 11/15/2019 @ 11 am

## 2019-11-08 DIAGNOSIS — Z7952 Long term (current) use of systemic steroids: Secondary | ICD-10-CM | POA: Diagnosis not present

## 2019-11-08 DIAGNOSIS — M353 Polymyalgia rheumatica: Secondary | ICD-10-CM | POA: Diagnosis not present

## 2019-11-15 ENCOUNTER — Encounter: Payer: Self-pay | Admitting: Primary Care

## 2019-11-15 ENCOUNTER — Ambulatory Visit (INDEPENDENT_AMBULATORY_CARE_PROVIDER_SITE_OTHER)
Admission: RE | Admit: 2019-11-15 | Discharge: 2019-11-15 | Disposition: A | Discharge status: Home / Self Care | Payer: Medicare Other | Source: Ambulatory Visit | Attending: Primary Care | Admitting: Primary Care

## 2019-11-15 ENCOUNTER — Ambulatory Visit (INDEPENDENT_AMBULATORY_CARE_PROVIDER_SITE_OTHER): Payer: Medicare Other | Admitting: Primary Care

## 2019-11-15 ENCOUNTER — Other Ambulatory Visit: Payer: Self-pay

## 2019-11-15 DIAGNOSIS — M8949 Other hypertrophic osteoarthropathy, multiple sites: Secondary | ICD-10-CM | POA: Diagnosis not present

## 2019-11-15 DIAGNOSIS — M8589 Other specified disorders of bone density and structure, multiple sites: Secondary | ICD-10-CM

## 2019-11-15 DIAGNOSIS — I7090 Unspecified atherosclerosis: Secondary | ICD-10-CM

## 2019-11-15 DIAGNOSIS — E785 Hyperlipidemia, unspecified: Secondary | ICD-10-CM | POA: Diagnosis not present

## 2019-11-15 DIAGNOSIS — R109 Unspecified abdominal pain: Secondary | ICD-10-CM | POA: Diagnosis not present

## 2019-11-15 DIAGNOSIS — K219 Gastro-esophageal reflux disease without esophagitis: Secondary | ICD-10-CM

## 2019-11-15 DIAGNOSIS — F411 Generalized anxiety disorder: Secondary | ICD-10-CM

## 2019-11-15 DIAGNOSIS — G4700 Insomnia, unspecified: Secondary | ICD-10-CM

## 2019-11-15 DIAGNOSIS — M353 Polymyalgia rheumatica: Secondary | ICD-10-CM

## 2019-11-15 DIAGNOSIS — M545 Low back pain: Secondary | ICD-10-CM | POA: Diagnosis not present

## 2019-11-15 DIAGNOSIS — Z7952 Long term (current) use of systemic steroids: Secondary | ICD-10-CM

## 2019-11-15 DIAGNOSIS — I890 Lymphedema, not elsewhere classified: Secondary | ICD-10-CM | POA: Diagnosis not present

## 2019-11-15 DIAGNOSIS — R7303 Prediabetes: Secondary | ICD-10-CM | POA: Diagnosis not present

## 2019-11-15 DIAGNOSIS — I10 Essential (primary) hypertension: Secondary | ICD-10-CM | POA: Diagnosis not present

## 2019-11-15 DIAGNOSIS — R079 Chest pain, unspecified: Secondary | ICD-10-CM

## 2019-11-15 DIAGNOSIS — J9809 Other diseases of bronchus, not elsewhere classified: Secondary | ICD-10-CM | POA: Diagnosis not present

## 2019-11-15 DIAGNOSIS — M159 Polyosteoarthritis, unspecified: Secondary | ICD-10-CM

## 2019-11-15 LAB — CBC
HCT: 41.6 % (ref 36.0–46.0)
Hemoglobin: 13.8 g/dL (ref 12.0–15.0)
MCHC: 33 g/dL (ref 30.0–36.0)
MCV: 85.3 fl (ref 78.0–100.0)
Platelets: 251 10*3/uL (ref 150.0–400.0)
RBC: 4.88 Mil/uL (ref 3.87–5.11)
RDW: 15.6 % — ABNORMAL HIGH (ref 11.5–15.5)
WBC: 8.9 10*3/uL (ref 4.0–10.5)

## 2019-11-15 LAB — POC URINALSYSI DIPSTICK (AUTOMATED)
Bilirubin, UA: NEGATIVE
Glucose, UA: NEGATIVE
Ketones, UA: NEGATIVE
Leukocytes, UA: NEGATIVE
Nitrite, UA: NEGATIVE
Protein, UA: NEGATIVE
Spec Grav, UA: 1.025 (ref 1.010–1.025)
Urobilinogen, UA: 0.2 E.U./dL
pH, UA: 6 (ref 5.0–8.0)

## 2019-11-15 LAB — COMPREHENSIVE METABOLIC PANEL
ALT: 29 U/L (ref 0–35)
AST: 27 U/L (ref 0–37)
Albumin: 4.2 g/dL (ref 3.5–5.2)
Alkaline Phosphatase: 61 U/L (ref 39–117)
BUN: 20 mg/dL (ref 6–23)
CO2: 31 mEq/L (ref 19–32)
Calcium: 9.8 mg/dL (ref 8.4–10.5)
Chloride: 102 mEq/L (ref 96–112)
Creatinine, Ser: 0.95 mg/dL (ref 0.40–1.20)
GFR: 55.89 mL/min — ABNORMAL LOW (ref >60.00)
Glucose, Bld: 115 mg/dL — ABNORMAL HIGH (ref 70–99)
Potassium: 3.2 mEq/L — ABNORMAL LOW (ref 3.5–5.1)
Sodium: 141 mEq/L (ref 135–145)
Total Bilirubin: 0.7 mg/dL (ref 0.2–1.2)
Total Protein: 7.4 g/dL (ref 6.0–8.3)

## 2019-11-15 LAB — LIPID PANEL
Cholesterol: 234 mg/dL — ABNORMAL HIGH (ref 0–200)
HDL: 65 mg/dL (ref >39.00)
LDL Cholesterol: 140 mg/dL — ABNORMAL HIGH (ref 0–99)
NonHDL: 169.14
Total CHOL/HDL Ratio: 4
Triglycerides: 148 mg/dL (ref 0.0–149.0)
VLDL: 29.6 mg/dL (ref 0.0–40.0)

## 2019-11-15 LAB — HEMOGLOBIN A1C: Hgb A1c MFr Bld: 6.9 % — ABNORMAL HIGH (ref 4.6–6.5)

## 2019-11-15 NOTE — Assessment & Plan Note (Signed)
Declines all statin therapy and Zetia for treatment. Repeat lipids pending.

## 2019-11-15 NOTE — Progress Notes (Signed)
Subjective:    Patient ID: Alyssa Roy, female    DOB: 25-Sep-1932, 86 y.o.   MRN: [id number]  HPI  This visit occurred during the SARS-CoV-2 public health emergency.  Safety protocols were in place, including screening questions prior to the visit, additional usage of staff PPE, and extensive cleaning of exam room while observing appropriate contact time as indicated for disinfecting solutions.   Alyssa Roy is a 86 year old female with a history of hypertension, GERD, atherosclerosis, polymyalgia, osteopenia, osteoarthritis, GAD, hyperlipidemia, prediabetes who presents today for [place] Part 2.   She continues to notice right lateral side pain, under the ribs that began while laying on the couch one year ago. She denies injury/trauma, mass, nausea/vomiting. Her pain is present constant, no worse with movement or eating. She describes her pain as achy mostly.   Immunizations: -Tetanus: Completed in 2013 -Influenza: Completed last season  -Shingles: Completed Zostavax -Pneumonia: Completed Prevnar in 13 and Pneumovax -Covid-19: Completed series  Mammogram: Completed in 2020 Dexa: Completed in 2019, osteopenia  Colonoscopy: Completed in 2017, no further imaging needed.  BP Readings from Last 3 Encounters:  11/15/19 126/80  10/10/19 (!) 155/83  04/12/19 131/85     Review of Systems  Respiratory:       Exertional dyspnea with mild exertion  Cardiovascular: Negative for chest pain.  Gastrointestinal: Negative for constipation, diarrhea, nausea and vomiting.  Neurological: Negative for dizziness.       Past Medical History:  Diagnosis Date  . Atherosclerosis   . Chronic venous stasis   . GAD (generalized anxiety disorder)   . Hyperglycemia   . Hyperlipidemia   . Insomnia   . Osteoarthritis   . Urinary tract infection      Social History   Socioeconomic History  . Marital status: Unknown    Spouse name: Not on file  . Number of children: Not on file  . Years  of education: Not on file  . Highest education level: Not on file  Occupational History  . Not on file  Tobacco Use  . Smoking status: Never Smoker  . Smokeless tobacco: Never Used  Substance and Sexual Activity  . Alcohol use: Not Currently  . Drug use: Not on file  . Sexual activity: Not on file  Other Topics Concern  . Not on file  Social History Narrative   Widow.   Retired.   Moved to [place] to live closer to daughter.   Social Determinants of Health   Financial Resource Strain: Low Risk   . Difficulty of Paying Living Expenses: Not hard at all  Food Insecurity: No Food Insecurity  . Worried About Charity fundraiser in the Last Year: Never true  . Ran Out of Food in the Last Year: Never true  Transportation Needs: No Transportation Needs  . Lack of Transportation (Medical): No  . Lack of Transportation (Non-Medical): No  Physical Activity: Inactive  . Days of Exercise per Week: 0 days  . Minutes of Exercise per Session: 0 min  Stress: No Stress Concern Present  . Feeling of Stress : Not at all  Social Connections:   . Frequency of Communication with Friends and Family:   . Frequency of Social Gatherings with Friends and Family:   . Attends Religious Services:   . Active Member of Clubs or Organizations:   . Attends Archivist Meetings:   Marland Kitchen Marital Status:   Intimate Partner Violence: Not At Risk  . Fear of Current or  Ex-Partner: No  . Emotionally Abused: No  . Physically Abused: No  . Sexually Abused: No    Past Surgical History:  Procedure Laterality Date  . ABDOMINAL HYSTERECTOMY  1979  . CHOLECYSTECTOMY  1984  . REPLACEMENT TOTAL KNEE Right 2007    Family History  Problem Relation Age of Onset  . Heart disease Mother   . Arthritis Mother   . Diabetes Father     Allergies  Allergen Reactions  . Crestor [Rosuvastatin Calcium]   . Feldene [Piroxicam]   . Keflex [Cephalexin]   . Lipitor [Atorvastatin Calcium]   . Zetia [Ezetimibe]    . Aspirin Other (See Comments)    Stomach discomfort    Current Outpatient Medications on File Prior to Visit  Medication Sig Dispense Refill  . acetaminophen (TYLENOL) 650 MG CR tablet Take 650 mg by mouth every 8 (eight) hours as needed for pain.    . Cholecalciferol (D-3-5) 125 MCG (5000 UT) capsule Take 5,000 Units by mouth daily.    Marland Kitchen co-enzyme Q-10 30 MG capsule Take 30 mg by mouth daily.    . Coenzyme Q10 (COQ10) 100 MG CAPS Take by mouth daily.    . Cyanocobalamin (B-12) 5000 MCG CAPS Take by mouth daily.    . famotidine (PEPCID) 20 MG tablet Take 1 tablet (20 mg total) by mouth daily. For heartburn. 90 tablet 1  . hydrochlorothiazide (HYDRODIURIL) 12.5 MG tablet Take 1 tablet (12.5 mg total) by mouth daily. For blood pressure and swelling. 90 tablet 1  . predniSONE (DELTASONE) 1 MG tablet Take 2 mg by mouth daily with breakfast.      No current facility-administered medications on file prior to visit.    BP 126/80   Pulse 80   Temp (!) 97.4 F (36.3 C) (Temporal)   Ht 4\' 11"  (1.499 m)   Wt 180 lb 12 oz (82 kg)   SpO2 98%   BMI 36.51 kg/m    Objective:   Physical Exam  Constitutional: She appears well-nourished.  Cardiovascular: Normal rate and regular rhythm.  Respiratory: Effort normal and breath sounds normal.  GI: Soft. Bowel sounds are normal. There is no abdominal tenderness.    Right lateral side pain, no tenderness on exam.  Musculoskeletal:     Cervical back: Neck supple.  Skin: Skin is warm and dry.  Psychiatric: She has a normal mood and affect.           Assessment & Plan:

## 2019-11-15 NOTE — Assessment & Plan Note (Signed)
Discussed the importance of a healthy diet and regular exercise in order for weight loss, and to reduce the risk of any potential medical problems.  Repeat A1C pending. 

## 2019-11-15 NOTE — Assessment & Plan Note (Signed)
Completed in April 2019, doesn't want to start treatment on bisphosphonate. She is compliant to calcium and vitamin D. Following with rheumatology.

## 2019-11-15 NOTE — Assessment & Plan Note (Signed)
Following with rheumatology, continue current regimen. 

## 2019-11-15 NOTE — Assessment & Plan Note (Signed)
Continued and intermittent, no change. Echo and prior work up unremarkable.

## 2019-11-15 NOTE — Assessment & Plan Note (Signed)
Following with vascular services, compliant to pumps. Encouraged regular activity.

## 2019-11-15 NOTE — Assessment & Plan Note (Signed)
Denies concerns for anxiety.

## 2019-11-15 NOTE — Assessment & Plan Note (Signed)
Continued, no clear etiology.  Abdominal exam unremarkable.  UA pending. Checking plain films of right lateral side.  Pain could be coming from spinal stenosis. History of cholecystectomy.

## 2019-11-15 NOTE — Assessment & Plan Note (Signed)
Following with rheumatology

## 2019-11-15 NOTE — Assessment & Plan Note (Signed)
Well controlled on famotidine, continue same.  

## 2019-11-15 NOTE — Patient Instructions (Signed)
Stop by the lab and xray prior to leaving today. I will notify you of your results once received.   Start exercising. You should be walking everyday.    Work on a healthy diet and drink plenty of water.  It was a pleasure to see you today!

## 2019-11-15 NOTE — Assessment & Plan Note (Signed)
Well controlled on HCTZ 12.5 mg, CMP pending.

## 2019-11-15 NOTE — Assessment & Plan Note (Signed)
Denies difficulty sleeping or anxiety. Continue to monitor.

## 2019-11-15 NOTE — Assessment & Plan Note (Signed)
Declines all statin treatment, also Zetia. Taking Co-Q10.  Repeat lipids pending.

## 2019-11-15 NOTE — Assessment & Plan Note (Signed)
Following with rheumatology, managed on prednisone. Bone density scan reviewed from 2019.

## 2019-11-16 ENCOUNTER — Telehealth: Payer: Self-pay | Admitting: Primary Care

## 2019-11-16 ENCOUNTER — Other Ambulatory Visit: Payer: Self-pay | Admitting: Primary Care

## 2019-11-16 DIAGNOSIS — R109 Unspecified abdominal pain: Secondary | ICD-10-CM

## 2019-11-16 DIAGNOSIS — R0602 Shortness of breath: Secondary | ICD-10-CM

## 2019-11-16 DIAGNOSIS — E785 Hyperlipidemia, unspecified: Secondary | ICD-10-CM

## 2019-11-16 DIAGNOSIS — I7090 Unspecified atherosclerosis: Secondary | ICD-10-CM

## 2019-11-16 MED ORDER — PRAVASTATIN SODIUM 40 MG PO TABS: 40.0000 mg | ORAL_TABLET | Freq: Every evening | ORAL | 0 refills | Status: DC

## 2019-11-16 NOTE — Telephone Encounter (Signed)
Patient is returning your call in regards to her lab results    Unable to attach to results notes

## 2019-11-17 ENCOUNTER — Other Ambulatory Visit: Payer: Self-pay | Admitting: Internal Medicine

## 2019-11-17 LAB — URINE CULTURE
MICRO NUMBER:: 10442381
SPECIMEN QUALITY:: ADEQUATE

## 2019-11-17 MED ORDER — NITROFURANTOIN MONOHYD MACRO 100 MG PO CAPS
100.0000 mg | ORAL_CAPSULE | Freq: Two times a day (BID) | ORAL | 0 refills | Status: DC
Start: 2019-11-17 — End: 2020-02-28

## 2019-11-17 NOTE — Telephone Encounter (Signed)
Patient advised. Appointment scheduled.  

## 2019-11-27 ENCOUNTER — Other Ambulatory Visit: Payer: Self-pay

## 2019-11-27 ENCOUNTER — Ambulatory Visit
Admission: RE | Admit: 2019-11-27 | Discharge: 2019-11-27 | Disposition: A | Discharge status: Home / Self Care | Payer: Medicare Other | Source: Ambulatory Visit | Attending: Primary Care | Admitting: Primary Care

## 2019-11-27 DIAGNOSIS — R0602 Shortness of breath: Secondary | ICD-10-CM | POA: Diagnosis not present

## 2019-11-27 DIAGNOSIS — R109 Unspecified abdominal pain: Secondary | ICD-10-CM

## 2019-11-27 DIAGNOSIS — R079 Chest pain, unspecified: Secondary | ICD-10-CM | POA: Diagnosis not present

## 2019-11-27 HISTORY — DX: Essential (primary) hypertension: I10

## 2019-11-27 HISTORY — DX: Type 2 diabetes mellitus without complications: E11.9

## 2019-11-27 MED ORDER — IOHEXOL 300 MG/ML  SOLN
75.0000 mL | Freq: Once | INTRAMUSCULAR | Status: AC | PRN
  Administered 2019-11-27: 75 mL via INTRAVENOUS

## 2019-11-28 ENCOUNTER — Telehealth: Payer: Self-pay | Admitting: Primary Care

## 2019-11-28 NOTE — Telephone Encounter (Signed)
Noted. Spoken to patient. Notified her of results and mailing copy to patient.

## 2019-11-28 NOTE — Telephone Encounter (Signed)
Patient stated that she had her CT scan done yesterday Patient is not able to get onto mychart so when the results come in she is asking for a call or for them to be mailed out to her.   FYI

## 2019-11-29 ENCOUNTER — Other Ambulatory Visit: Payer: Self-pay | Admitting: Primary Care

## 2019-11-29 DIAGNOSIS — R109 Unspecified abdominal pain: Secondary | ICD-10-CM

## 2019-11-29 DIAGNOSIS — M159 Polyosteoarthritis, unspecified: Secondary | ICD-10-CM

## 2019-11-29 DIAGNOSIS — M8949 Other hypertrophic osteoarthropathy, multiple sites: Secondary | ICD-10-CM

## 2019-12-01 ENCOUNTER — Ambulatory Visit (INDEPENDENT_AMBULATORY_CARE_PROVIDER_SITE_OTHER)
Admission: RE | Admit: 2019-12-01 | Discharge: 2019-12-01 | Disposition: A | Discharge status: Home / Self Care | Payer: Medicare Other | Source: Ambulatory Visit | Attending: Primary Care | Admitting: Primary Care

## 2019-12-01 DIAGNOSIS — M159 Polyosteoarthritis, unspecified: Secondary | ICD-10-CM

## 2019-12-01 DIAGNOSIS — R109 Unspecified abdominal pain: Secondary | ICD-10-CM

## 2019-12-01 DIAGNOSIS — M1611 Unilateral primary osteoarthritis, right hip: Secondary | ICD-10-CM | POA: Diagnosis not present

## 2019-12-01 DIAGNOSIS — M48061 Spinal stenosis, lumbar region without neurogenic claudication: Secondary | ICD-10-CM | POA: Diagnosis not present

## 2019-12-01 DIAGNOSIS — M8949 Other hypertrophic osteoarthropathy, multiple sites: Secondary | ICD-10-CM | POA: Diagnosis not present

## 2019-12-04 ENCOUNTER — Encounter: Payer: Self-pay | Admitting: *Deleted

## 2019-12-05 DIAGNOSIS — Z7952 Long term (current) use of systemic steroids: Secondary | ICD-10-CM | POA: Diagnosis not present

## 2019-12-05 DIAGNOSIS — M353 Polymyalgia rheumatica: Secondary | ICD-10-CM | POA: Diagnosis not present

## 2019-12-18 ENCOUNTER — Telehealth: Payer: Self-pay | Admitting: Primary Care

## 2019-12-18 NOTE — Telephone Encounter (Signed)
Please notify patient that it may be easier for her to continue with her current practice as they have her current records. If she prefers [facility] then I'm happy to refer her.

## 2019-12-18 NOTE — Telephone Encounter (Signed)
Patient called today, stated that her rheumatologist at [facility] is leaving the practice. She wanted to know if you can recommend a provider for her to start seeing within [facility] or if you think she should stay at the office with [facility] and just see another provider there

## 2019-12-20 NOTE — Telephone Encounter (Signed)
Spoken and notified patient of [name]'s comments. Patient verbalized understanding.  

## 2020-02-12 ENCOUNTER — Other Ambulatory Visit: Payer: Self-pay | Admitting: Primary Care

## 2020-02-12 DIAGNOSIS — I7090 Unspecified atherosclerosis: Secondary | ICD-10-CM

## 2020-02-12 DIAGNOSIS — E785 Hyperlipidemia, unspecified: Secondary | ICD-10-CM

## 2020-02-16 ENCOUNTER — Other Ambulatory Visit: Payer: Self-pay | Admitting: Primary Care

## 2020-02-16 DIAGNOSIS — I10 Essential (primary) hypertension: Secondary | ICD-10-CM

## 2020-02-16 DIAGNOSIS — R600 Localized edema: Secondary | ICD-10-CM

## 2020-02-18 ENCOUNTER — Other Ambulatory Visit: Payer: Self-pay | Admitting: Primary Care

## 2020-02-18 DIAGNOSIS — K219 Gastro-esophageal reflux disease without esophagitis: Secondary | ICD-10-CM

## 2020-02-21 ENCOUNTER — Ambulatory Visit: Payer: Medicare Other | Admitting: Primary Care

## 2020-02-22 DIAGNOSIS — M353 Polymyalgia rheumatica: Secondary | ICD-10-CM | POA: Insufficient documentation

## 2020-02-22 DIAGNOSIS — M8949 Other hypertrophic osteoarthropathy, multiple sites: Secondary | ICD-10-CM | POA: Diagnosis not present

## 2020-02-22 DIAGNOSIS — Z7952 Long term (current) use of systemic steroids: Secondary | ICD-10-CM | POA: Diagnosis not present

## 2020-02-28 ENCOUNTER — Encounter: Payer: Self-pay | Admitting: Primary Care

## 2020-02-28 ENCOUNTER — Other Ambulatory Visit: Payer: Self-pay

## 2020-02-28 ENCOUNTER — Ambulatory Visit (INDEPENDENT_AMBULATORY_CARE_PROVIDER_SITE_OTHER): Payer: Medicare Other | Admitting: Primary Care

## 2020-02-28 VITALS — BP 130/80 | HR 78 | Temp 96.5°F | Ht 59.0 in | Wt 173.0 lb

## 2020-02-28 DIAGNOSIS — M8949 Other hypertrophic osteoarthropathy, multiple sites: Secondary | ICD-10-CM | POA: Diagnosis not present

## 2020-02-28 DIAGNOSIS — I7090 Unspecified atherosclerosis: Secondary | ICD-10-CM

## 2020-02-28 DIAGNOSIS — M353 Polymyalgia rheumatica: Secondary | ICD-10-CM | POA: Diagnosis not present

## 2020-02-28 DIAGNOSIS — E785 Hyperlipidemia, unspecified: Secondary | ICD-10-CM | POA: Diagnosis not present

## 2020-02-28 DIAGNOSIS — R300 Dysuria: Secondary | ICD-10-CM

## 2020-02-28 DIAGNOSIS — F411 Generalized anxiety disorder: Secondary | ICD-10-CM | POA: Diagnosis not present

## 2020-02-28 DIAGNOSIS — E119 Type 2 diabetes mellitus without complications: Secondary | ICD-10-CM

## 2020-02-28 DIAGNOSIS — M159 Polyosteoarthritis, unspecified: Secondary | ICD-10-CM

## 2020-02-28 DIAGNOSIS — E538 Deficiency of other specified B group vitamins: Secondary | ICD-10-CM | POA: Insufficient documentation

## 2020-02-28 LAB — POCT GLYCOSYLATED HEMOGLOBIN (HGB A1C): Hemoglobin A1C: 6.3 % — AB (ref 4.0–5.6)

## 2020-02-28 LAB — POC URINALSYSI DIPSTICK (AUTOMATED)
Bilirubin, UA: NEGATIVE
Glucose, UA: NEGATIVE
Ketones, UA: NEGATIVE
Nitrite, UA: NEGATIVE
Protein, UA: POSITIVE — AB
Spec Grav, UA: 1.025 (ref 1.010–1.025)
Urobilinogen, UA: 0.2 E.U./dL
pH, UA: 5.5 (ref 5.0–8.0)

## 2020-02-28 MED ORDER — NITROFURANTOIN MONOHYD MACRO 100 MG PO CAPS: 100.0000 mg | ORAL_CAPSULE | Freq: Two times a day (BID) | ORAL | 0 refills | Status: AC

## 2020-02-28 MED ORDER — DICLOFENAC SODIUM 1 % EX GEL: 2.0000 g | Freq: Three times a day (TID) | CUTANEOUS | 1 refills | Status: AC | PRN

## 2020-02-28 NOTE — Assessment & Plan Note (Signed)
Compliant to statin therapy, repeat lipids pending.

## 2020-02-28 NOTE — Assessment & Plan Note (Signed)
Compliant to daily B12, repeat labs pending.

## 2020-02-28 NOTE — Assessment & Plan Note (Signed)
Approve letter for support animal during travel.

## 2020-02-28 NOTE — Assessment & Plan Note (Signed)
Acute symptoms x 2-3 days. UA today with 2+ leuks, 2+ blood. Culture sent.  Given history of acute cystitis, coupled with her symptoms, we will go ahead and treat.   Rx for Macrobid course provided.

## 2020-02-28 NOTE — Assessment & Plan Note (Signed)
Compliant to pravastatin, repeat lipids pending.

## 2020-02-28 NOTE — Progress Notes (Signed)
Subjective:    Patient ID: Alyssa Roy, female    DOB: August 19, 1932, 86 y.o.   MRN: [id number]  HPI  This visit occurred during the SARS-CoV-2 public health emergency.  Safety protocols were in place, including screening questions prior to the visit, additional usage of staff PPE, and extensive cleaning of exam room while observing appropriate contact time as indicated for disinfecting solutions.   Alyssa Roy is a 86 year old female with a history of hypertension, atherosclerosis, GERD, osteoarthritis, polymyalgia, prediabetes, GAD who presents today for follow up.  1) Type 2 Diabetes: New diagnosis as of May 2021. Managed on prednisone chronically for PMR per rheumatology. A1C of 6.9 in May 2021. She actually stopped prednisone one week ago.   Current medications include: None.   She is checking his/her blood glucose 0 times daily.  Last A1C: 6.9 in May 2021, 6.3 today.  Last Eye Exam: UTD Last Foot Exam: Due Pneumonia Vaccination: Completed last in 2019 ACE/ARB: None. Urine microalbumin pending. Statin: Pravastatin   2) Hyperlipidemia: Currently managed on pravastatin 40 mg which was initiated in May 2021. Historically has refused treatment on statin therapy and Zetia, agreed in May due to new diagnosis of diabetes.   Today she endorses compliance to pravastatin. She denies side effects.   3) Anxiety: She would like a letter for permission to take her small dog with her on the plane and restaurant. She doesn't have a trip planned but may travel during the holidays. She is requesting the support animal due to her chronic anxiety.   4) Dysuria: Acute for the last 2-3 days. Also with urinary frequency, suprapubic discomfort. She denies hematuria. History of acute cystitis.   BP Readings from Last 3 Encounters:  02/28/20 130/80  11/15/19 126/80  10/10/19 (!) 155/83     Review of Systems  Constitutional: Positive for fatigue.  Respiratory: Negative for shortness of  breath.   Cardiovascular: Negative for chest pain.  Genitourinary: Positive for dysuria and frequency. Negative for hematuria and vaginal discharge.  Psychiatric/Behavioral: The patient is nervous/anxious.        Past Medical History:  Diagnosis Date  . Atherosclerosis   . Chronic venous stasis   . Diabetes mellitus without complication ([place])   . GAD (generalized anxiety disorder)   . Hyperglycemia   . Hyperlipidemia   . Hypertension   . Insomnia   . Osteoarthritis   . Urinary tract infection      Social History   Socioeconomic History  . Marital status: Unknown    Spouse name: Not on file  . Number of children: Not on file  . Years of education: Not on file  . Highest education level: Not on file  Occupational History  . Not on file  Tobacco Use  . Smoking status: Never Smoker  . Smokeless tobacco: Never Used  Substance and Sexual Activity  . Alcohol use: Not Currently  . Drug use: Not on file  . Sexual activity: Not on file  Other Topics Concern  . Not on file  Social History Narrative   Widow.   Retired.   Moved to [place] to live closer to daughter.   Social Determinants of Health   Financial Resource Strain: Low Risk   . Difficulty of Paying Living Expenses: Not hard at all  Food Insecurity: No Food Insecurity  . Worried About Charity fundraiser in the Last Year: Never true  . Ran Out of Food in the Last Year: Never true  Transportation  Needs: No Transportation Needs  . Lack of Transportation (Medical): No  . Lack of Transportation (Non-Medical): No  Physical Activity: Inactive  . Days of Exercise per Week: 0 days  . Minutes of Exercise per Session: 0 min  Stress: No Stress Concern Present  . Feeling of Stress : Not at all  Social Connections:   . Frequency of Communication with Friends and Family:   . Frequency of Social Gatherings with Friends and Family:   . Attends Religious Services:   . Active Member of Clubs or Organizations:   .  Attends Archivist Meetings:   Marland Kitchen Marital Status:   Intimate Partner Violence: Not At Risk  . Fear of Current or Ex-Partner: No  . Emotionally Abused: No  . Physically Abused: No  . Sexually Abused: No    Past Surgical History:  Procedure Laterality Date  . ABDOMINAL HYSTERECTOMY  1979  . CHOLECYSTECTOMY  1984  . REPLACEMENT TOTAL KNEE Right 2007    Family History  Problem Relation Age of Onset  . Heart disease Mother   . Arthritis Mother   . Diabetes Father     Allergies  Allergen Reactions  . Crestor [Rosuvastatin Calcium]   . Feldene [Piroxicam]   . Keflex [Cephalexin]   . Lipitor [Atorvastatin Calcium]   . Zetia [Ezetimibe]   . Aspirin Other (See Comments)    Stomach discomfort    Current Outpatient Medications on File Prior to Visit  Medication Sig Dispense Refill  . acetaminophen (TYLENOL) 650 MG CR tablet Take 650 mg by mouth every 8 (eight) hours as needed for pain.    . Cholecalciferol (D-3-5) 125 MCG (5000 UT) capsule Take 5,000 Units by mouth daily.    Marland Kitchen co-enzyme Q-10 30 MG capsule Take 30 mg by mouth daily.    . Coenzyme Q10 (COQ10) 100 MG CAPS Take by mouth daily.    . Cyanocobalamin (B-12) 5000 MCG CAPS Take by mouth daily.    . famotidine (PEPCID) 20 MG tablet TAKE 1 TABLET (20 MG TOTAL) BY MOUTH DAILY. FOR HEARTBURN. 90 tablet 1  . hydrochlorothiazide (HYDRODIURIL) 12.5 MG tablet TAKE 1 TABLET (12.5 MG TOTAL) BY MOUTH DAILY. FOR BLOOD PRESSURE AND SWELLING. 90 tablet 0  . pravastatin (PRAVACHOL) 40 MG tablet TAKE 1 TABLET (40 MG TOTAL) BY MOUTH EVERY EVENING. FOR CHOLESTEROL. 90 tablet 0   No current facility-administered medications on file prior to visit.    BP 130/80   Pulse 78   Temp (!) 96.5 F (35.8 C) (Temporal)   Ht 4\' 11"  (1.499 m)   Wt 173 lb (78.5 kg)   SpO2 98%   BMI 34.94 kg/m    Objective:   Physical Exam Cardiovascular:     Rate and Rhythm: Normal rate and regular rhythm.  Pulmonary:     Effort: Pulmonary effort  is normal.     Breath sounds: Normal breath sounds.  Musculoskeletal:     Cervical back: Neck supple.  Skin:    General: Skin is warm and dry.  Psychiatric:        Mood and Affect: Mood normal.            Assessment & Plan:

## 2020-02-28 NOTE — Assessment & Plan Note (Signed)
Doing well on Voltaren Gel, Rx sent to pharmacy.

## 2020-02-28 NOTE — Assessment & Plan Note (Signed)
Diagnosed in may 2021 with A1C of 6.9. A1C today of 6.3 which is back in the prediabetic range.  Commended her on dietary changes and some weight loss. Suspected that this was steroid induced. Now that she's off of prednisone her prediabetes may resolve.   Repeat A1C in 3 months. Lab test.

## 2020-02-28 NOTE — Assessment & Plan Note (Signed)
Now off of prednisone as of one week. Following with rheumatology.

## 2020-02-28 NOTE — Patient Instructions (Addendum)
Stop by the lab prior to leaving today. I will notify you of your results once received.   Start Macrobid (nitrofurantoin) tablets for urinary tract infection. Take 1 tablet by mouth twice daily for five days.  Schedule a lab appointment for 3 months to repeat diabetes test. We need to make sure this has improved.  It was a pleasure to see you today!

## 2020-02-29 LAB — MICROALBUMIN / CREATININE URINE RATIO
Creatinine,U: 121.1 mg/dL
Microalb Creat Ratio: 3.9 mg/g (ref 0.0–30.0)
Microalb, Ur: 4.7 mg/dL — ABNORMAL HIGH (ref 0.0–1.9)

## 2020-02-29 LAB — HEPATIC FUNCTION PANEL
ALT: 17 U/L (ref 0–35)
AST: 18 U/L (ref 0–37)
Albumin: 3.9 g/dL (ref 3.5–5.2)
Alkaline Phosphatase: 61 U/L (ref 39–117)
Bilirubin, Direct: 0.1 mg/dL (ref 0.0–0.3)
Total Bilirubin: 0.4 mg/dL (ref 0.2–1.2)
Total Protein: 6.7 g/dL (ref 6.0–8.3)

## 2020-02-29 LAB — TSH: TSH: 3.46 u[IU]/mL (ref 0.35–4.50)

## 2020-02-29 LAB — LIPID PANEL
Cholesterol: 191 mg/dL (ref 0–200)
HDL: 54 mg/dL (ref >39.00)
LDL Cholesterol: 103 mg/dL — ABNORMAL HIGH (ref 0–99)
NonHDL: 137.28
Total CHOL/HDL Ratio: 4
Triglycerides: 170 mg/dL — ABNORMAL HIGH (ref 0.0–149.0)
VLDL: 34 mg/dL (ref 0.0–40.0)

## 2020-02-29 LAB — VITAMIN B12: Vitamin B-12: >1526 pg/mL — ABNORMAL HIGH (ref 211–911)

## 2020-03-01 LAB — URINE CULTURE
MICRO NUMBER:: 10842176
SPECIMEN QUALITY:: ADEQUATE

## 2020-03-05 ENCOUNTER — Other Ambulatory Visit: Payer: Self-pay | Admitting: Primary Care

## 2020-03-05 DIAGNOSIS — E785 Hyperlipidemia, unspecified: Secondary | ICD-10-CM

## 2020-03-05 MED ORDER — PRAVASTATIN SODIUM 80 MG PO TABS: 80.0000 mg | ORAL_TABLET | Freq: Every evening | ORAL | 3 refills | Status: DC

## 2020-03-06 ENCOUNTER — Encounter: Payer: Self-pay | Admitting: *Deleted

## 2020-04-16 ENCOUNTER — Encounter (INDEPENDENT_AMBULATORY_CARE_PROVIDER_SITE_OTHER): Payer: Self-pay | Admitting: Vascular Surgery

## 2020-04-16 ENCOUNTER — Other Ambulatory Visit: Payer: Self-pay

## 2020-04-16 ENCOUNTER — Ambulatory Visit (INDEPENDENT_AMBULATORY_CARE_PROVIDER_SITE_OTHER): Payer: Medicare Other | Admitting: Vascular Surgery

## 2020-04-16 VITALS — BP 130/80 | HR 76 | Ht <= 58 in | Wt 168.0 lb

## 2020-04-16 DIAGNOSIS — I10 Essential (primary) hypertension: Secondary | ICD-10-CM

## 2020-04-16 DIAGNOSIS — E119 Type 2 diabetes mellitus without complications: Secondary | ICD-10-CM

## 2020-04-16 DIAGNOSIS — I890 Lymphedema, not elsewhere classified: Secondary | ICD-10-CM

## 2020-04-16 DIAGNOSIS — Z23 Encounter for immunization: Secondary | ICD-10-CM | POA: Diagnosis not present

## 2020-04-16 NOTE — Progress Notes (Signed)
MRN : [id number]  Alyssa Roy is a 86 y.o. ([birth year]) female who presents with chief complaint of  Chief Complaint  Patient presents with  . Follow-up    6 mo no studies  .  History of Present Illness: Patient returns today in follow up of her leg swelling and pain. She is doing well.  She has Velcro wraps but is interested in getting some more stockings from online and potentially trying those that will go over her knee. Mild to moderate left leg swelling that is stable to improved.   Current Outpatient Medications  Medication Sig Dispense Refill  . acetaminophen (TYLENOL) 650 MG CR tablet Take 650 mg by mouth every 8 (eight) hours as needed for pain.    . Cholecalciferol (D-3-5) 125 MCG (5000 UT) capsule Take 5,000 Units by mouth daily.    Marland Kitchen co-enzyme Q-10 30 MG capsule Take 30 mg by mouth daily.    . Cyanocobalamin (B-12) 5000 MCG CAPS Take by mouth daily.    . diclofenac Sodium (VOLTAREN) 1 % GEL Apply 2 g topically 3 (three) times daily as needed. 100 g 1  . famotidine (PEPCID) 20 MG tablet TAKE 1 TABLET (20 MG TOTAL) BY MOUTH DAILY. FOR HEARTBURN. 90 tablet 1  . hydrochlorothiazide (HYDRODIURIL) 12.5 MG tablet TAKE 1 TABLET (12.5 MG TOTAL) BY MOUTH DAILY. FOR BLOOD PRESSURE AND SWELLING. 90 tablet 0  . pravastatin (PRAVACHOL) 80 MG tablet Take 1 tablet (80 mg total) by mouth every evening. For cholesterol. 90 tablet 3  . Coenzyme Q10 (COQ10) 100 MG CAPS Take by mouth daily. (Patient not taking: Reported on 04/16/2020)     No current facility-administered medications for this visit.    Past Medical History:  Diagnosis Date  . Atherosclerosis   . Chronic venous stasis   . Diabetes mellitus without complication ([place])   . GAD (generalized anxiety disorder)   . Hyperglycemia   . Hyperlipidemia   . Hypertension   . Insomnia   . Osteoarthritis   . Urinary tract infection     Past Surgical History:  Procedure Laterality Date  . ABDOMINAL HYSTERECTOMY  1979  .  CHOLECYSTECTOMY  1984  . REPLACEMENT TOTAL KNEE Right 2007     Social History   Tobacco Use  . Smoking status: Never Smoker  . Smokeless tobacco: Never Used  Substance Use Topics  . Alcohol use: Not Currently  . Drug use: Not on file      Family History  Problem Relation Age of Onset  . Heart disease Mother   . Arthritis Mother   . Diabetes Father     Allergies  Allergen Reactions  . Crestor [Rosuvastatin Calcium]   . Feldene [Piroxicam]   . Keflex [Cephalexin]   . Lipitor [Atorvastatin Calcium]   . Zetia [Ezetimibe]   . Aspirin Other (See Comments)    Stomach discomfort      REVIEW OF SYSTEMS (Negative unless checked) Constitutional: [] ??Weight loss[] ??Fever[] ??Chills Cardiac:[] ??Chest pain[] ??Atrial Fibrillation[] ??Palpitations [] ??Shortness of breath when laying flat [] ??Shortness of breath with exertion. [] ??Shortness of breath at rest Vascular: [] ??Pain in legs with walking[] ??Pain in legswith standing[] ??Pain in legs when laying flat [] ??Claudication [] ??Pain in feet when laying flat [] ??History of DVT [] ??Phlebitis [x] ??Swelling in legs [] ??Varicose veins [] ??Non-healing ulcers Pulmonary: [] ??Uses home oxygen [] ??Productive cough[] ??Hemoptysis [] ??Wheeze [] ??COPD [] ??Asthma Neurologic: [] ??Dizziness[] ??Seizures [] ??Blackouts[] ??History of stroke [] ??History of TIA[] ??Aphasia [] ??Temporary Blindness[] ??Weaknessor numbness in arm [] ??Weakness or numbnessin leg Musculoskeletal:[] ??Joint swelling [] ??Joint pain [] ??Low back pain [] ??History of Knee Replacement [x] ??Arthritis [] ??back Surgeries[] ??Spinal Stenosis  Hematologic:[] ??Easy bruising[] ??Easy bleeding [] ??Hypercoagulable state [] ??Anemic Gastrointestinal:[] ??Diarrhea [] ??Vomiting[] ??Gastroesophageal reflux/heartburn[] ??Difficulty swallowing. [] ??Abdominal pain Genitourinary: [] ??Chronic kidney disease  [] ??Difficulturination [] ??Anuric[] ??Blood in urine [] ??Frequenturination [] ??Burning with urination[] ??Hematuria Skin: [x] ??Rashes [] ??Ulcers [] ??Wounds Psychological: [x] ??History of anxiety[x] ??History of major depression [] ??Memory Difficulties  Physical Examination  BP 130/80   Pulse 76   Ht 4\' 7"  (1.397 m)   Wt 168 lb (76.2 kg)   BMI 39.05 kg/m  Gen:  WD/WN, NAD. Appears younger than stated age. Head: [place]/AT, No temporalis wasting. Ear/Nose/Throat: Hearing grossly intact, nares w/o erythema or drainage Eyes: Conjunctiva clear. Sclera non-icteric Neck: Supple.  Trachea midline Pulmonary:  Good air movement, no use of accessory muscles.  Cardiac: RRR, no JVD Vascular:  Vessel Right Left  Radial Palpable Palpable       Musculoskeletal: M/S 5/5 throughout.  No deformity or atrophy. 1+ LLE edema, trace RLE edema. Neurologic: Sensation grossly intact in extremities.  Symmetrical.  Speech is fluent.  Psychiatric: Judgment intact, Mood & affect appropriate for pt's clinical situation. Dermatologic: No rashes or ulcers noted.  No cellulitis or open wounds.       Labs Recent Results (from the past 2160 hour(s))  POCT glycosylated hemoglobin (Hb A1C)     Status: Abnormal   Collection Time: 02/28/20  2:18 PM  Result Value Ref Range   Hemoglobin A1C 6.3 (A) 4.0 - 5.6 %   HbA1c POC (<> result, manual entry)     HbA1c, POC (prediabetic range)     HbA1c, POC (controlled diabetic range)    POCT Urinalysis Dipstick (Automated)     Status: Abnormal   Collection Time: 02/28/20  2:38 PM  Result Value Ref Range   Color, UA Yellow    Clarity, UA Cloudy    Glucose, UA Negative Negative   Bilirubin, UA Negative    Ketones, UA Negative    Spec Grav, UA 1.025 1.010 - 1.025   Blood, UA 2+    pH, UA 5.5 5.0 - 8.0   Protein, UA Positive (A) Negative    Comment: +-   Urobilinogen, UA 0.2 0.2 or 1.0 E.U./dL   Nitrite, UA Negative    Leukocytes, UA Moderate (2+)  (A) Negative  Lipid panel     Status: Abnormal   Collection Time: 02/28/20  2:46 PM  Result Value Ref Range   Cholesterol 191 0 - 200 mg/dL    Comment: ATP III Classification       Desirable:  < 200 mg/dL               Borderline High:  200 - 239 mg/dL          High:  > = 240 mg/dL   Triglycerides 170.0 (H) 0 - 149 mg/dL    Comment: Normal:  <150 mg/dLBorderline High:  150 - 199 mg/dL   HDL 54.00 >39.00 mg/dL   VLDL 34.0 0.0 - 40.0 mg/dL   LDL Cholesterol 103 (H) 0 - 99 mg/dL   Total CHOL/HDL Ratio 4     Comment:                Men          Women1/2 Average Risk     3.4          3.3Average Risk          5.0          4.42X Average Risk          9.6          7.13X Average  Risk          15.0          11.0                       NonHDL 137.28     Comment: NOTE:  Non-HDL goal should be 30 mg/dL higher than patient's LDL goal (i.e. LDL goal of < 70 mg/dL, would have non-HDL goal of < 100 mg/dL)  Microalbumin / creatinine urine ratio     Status: Abnormal   Collection Time: 02/28/20  2:46 PM  Result Value Ref Range   Microalb, Ur 4.7 (H) 0.0 - 1.9 mg/dL   Creatinine,U 121.1 mg/dL   Microalb Creat Ratio 3.9 0.0 - 30.0 mg/g  Hepatic function panel     Status: None   Collection Time: 02/28/20  2:46 PM  Result Value Ref Range   Total Bilirubin 0.4 0.2 - 1.2 mg/dL   Bilirubin, Direct 0.1 0.0 - 0.3 mg/dL   Alkaline Phosphatase 61 39 - 117 U/L   AST 18 0 - 37 U/L   ALT 17 0 - 35 U/L   Total Protein 6.7 6.0 - 8.3 g/dL   Albumin 3.9 3.5 - 5.2 g/dL  Urine Culture     Status: Abnormal   Collection Time: 02/28/20  2:46 PM   Specimen: Urine  Result Value Ref Range   MICRO NUMBER: [id number]    SPECIMEN QUALITY: Adequate    Sample Source URINE    STATUS: FINAL    ISOLATE 1: Escherichia coli (A)     Comment: Greater than 100,000 CFU/mL of Escherichia coli      Susceptibility   Escherichia coli - URINE CULTURE, REFLEX    AMOX/CLAVULANIC <=2 Sensitive     AMPICILLIN >=32 Resistant      AMPICILLIN/SULBACTAM <=2 Sensitive     CEFAZOLIN* <=4 Not Reportable      * For infections other than uncomplicated UTIcaused by E. coli, K. pneumoniae or P. mirabilis:Cefazolin is resistant if MIC > or = 8 mcg/mL.(Distinguishing susceptible versus intermediatefor isolates with MIC < or = 4 mcg/mL requiresadditional testing.)For uncomplicated UTI caused by E. coli,K. pneumoniae or P. mirabilis: Cefazolin issusceptible if MIC <32 mcg/mL and predictssusceptible to the oral agents cefaclor, cefdinir,cefpodoxime, cefprozil, cefuroxime, cephalexinand loracarbef.    CEFEPIME <=1 Sensitive     CEFTRIAXONE <=1 Sensitive     CIPROFLOXACIN <=0.25 Sensitive     LEVOFLOXACIN <=0.12 Sensitive     ERTAPENEM <=0.5 Sensitive     GENTAMICIN <=1 Sensitive     IMIPENEM <=0.25 Sensitive     NITROFURANTOIN <=16 Sensitive     PIP/TAZO <=4 Sensitive     TOBRAMYCIN <=1 Sensitive     TRIMETH/SULFA* <=20 Sensitive      * For infections other than uncomplicated UTIcaused by E. coli, K. pneumoniae or P. mirabilis:Cefazolin is resistant if MIC > or = 8 mcg/mL.(Distinguishing susceptible versus intermediatefor isolates with MIC < or = 4 mcg/mL requiresadditional testing.)For uncomplicated UTI caused by E. coli,K. pneumoniae or P. mirabilis: Cefazolin issusceptible if MIC <32 mcg/mL and predictssusceptible to the oral agents cefaclor, cefdinir,cefpodoxime, cefprozil, cefuroxime, cephalexinand loracarbef.Legend:S = Susceptible  I = IntermediateR = Resistant  NS = Not susceptible* = Not tested  NR = Not reported**NN = See antimicrobic comments  Vitamin B12     Status: Abnormal   Collection Time: 02/28/20  2:46 PM  Result Value Ref Range   Vitamin B-12 >1526 (H) 211 - 911 pg/mL  TSH  Status: None   Collection Time: 02/28/20  2:46 PM  Result Value Ref Range   TSH 3.46 0.35 - 4.50 uIU/mL    Radiology No results found.  Assessment/Plan  Lymphedema Symptoms are currently reasonably well controlled.  Continue  compression, elevation, and activity.  At this point, I have offered her either an annual follow-up or to return as needed if her symptoms worsen.  She would prefer the latter.  Essential hypertension blood pressure control important in reducing the progression of atherosclerotic disease. On appropriate oral medications.   Type 2 diabetes mellitus (HCC) blood glucose control important in reducing the progression of atherosclerotic disease. Also, involved in wound healing. On appropriate medications.     Leotis Pain, MD  04/16/2020 2:17 PM    This note was created with Dragon medical transcription system.  Any errors from dictation are purely unintentional

## 2020-04-16 NOTE — Assessment & Plan Note (Signed)
blood pressure control important in reducing the progression of atherosclerotic disease. On appropriate oral medications.  

## 2020-04-16 NOTE — Assessment & Plan Note (Signed)
blood glucose control important in reducing the progression of atherosclerotic disease. Also, involved in wound healing. On appropriate medications.  

## 2020-04-16 NOTE — Assessment & Plan Note (Signed)
Symptoms are currently reasonably well controlled.  Continue compression, elevation, and activity.  At this point, I have offered her either an annual follow-up or to return as needed if her symptoms worsen.  She would prefer the latter.

## 2020-04-17 ENCOUNTER — Telehealth: Payer: Self-pay

## 2020-04-17 NOTE — Telephone Encounter (Signed)
Updated notes.

## 2020-04-17 NOTE — Telephone Encounter (Signed)
Pt said she received the 3rd Covid Booster on 04/15/20 and her Flu shot on 04/16/20. [facility]

## 2020-05-19 ENCOUNTER — Other Ambulatory Visit: Payer: Self-pay | Admitting: Primary Care

## 2020-05-19 DIAGNOSIS — E538 Deficiency of other specified B group vitamins: Secondary | ICD-10-CM

## 2020-05-19 DIAGNOSIS — E785 Hyperlipidemia, unspecified: Secondary | ICD-10-CM

## 2020-05-19 DIAGNOSIS — I7090 Unspecified atherosclerosis: Secondary | ICD-10-CM

## 2020-05-23 DIAGNOSIS — M353 Polymyalgia rheumatica: Secondary | ICD-10-CM | POA: Diagnosis not present

## 2020-05-23 DIAGNOSIS — M792 Neuralgia and neuritis, unspecified: Secondary | ICD-10-CM | POA: Diagnosis not present

## 2020-05-23 DIAGNOSIS — M65321 Trigger finger, right index finger: Secondary | ICD-10-CM | POA: Diagnosis not present

## 2020-05-24 ENCOUNTER — Other Ambulatory Visit: Payer: Self-pay | Admitting: Primary Care

## 2020-05-24 DIAGNOSIS — R600 Localized edema: Secondary | ICD-10-CM

## 2020-05-24 DIAGNOSIS — I10 Essential (primary) hypertension: Secondary | ICD-10-CM

## 2020-05-24 NOTE — Telephone Encounter (Signed)
Pharmacy requests refill on: Hydrochlorothiazide 12.5 mg   LAST REFILL: 02/16/2020 LAST OV: 02/28/2020 NEXT OV: 05/30/2020 PHARMACY: [id number] [place], [place]

## 2020-05-30 ENCOUNTER — Ambulatory Visit (INDEPENDENT_AMBULATORY_CARE_PROVIDER_SITE_OTHER): Payer: Medicare Other | Admitting: Primary Care

## 2020-05-30 ENCOUNTER — Encounter: Payer: Self-pay | Admitting: Primary Care

## 2020-05-30 ENCOUNTER — Other Ambulatory Visit: Payer: Medicare Other

## 2020-05-30 ENCOUNTER — Other Ambulatory Visit: Payer: Self-pay

## 2020-05-30 VITALS — BP 140/72 | HR 63 | Temp 97.5°F | Ht <= 58 in | Wt 170.0 lb

## 2020-05-30 DIAGNOSIS — M159 Polyosteoarthritis, unspecified: Secondary | ICD-10-CM

## 2020-05-30 DIAGNOSIS — N6459 Other signs and symptoms in breast: Secondary | ICD-10-CM | POA: Diagnosis not present

## 2020-05-30 DIAGNOSIS — E785 Hyperlipidemia, unspecified: Secondary | ICD-10-CM

## 2020-05-30 DIAGNOSIS — R600 Localized edema: Secondary | ICD-10-CM

## 2020-05-30 DIAGNOSIS — E538 Deficiency of other specified B group vitamins: Secondary | ICD-10-CM | POA: Diagnosis not present

## 2020-05-30 DIAGNOSIS — M8949 Other hypertrophic osteoarthropathy, multiple sites: Secondary | ICD-10-CM | POA: Diagnosis not present

## 2020-05-30 LAB — CBC WITH DIFFERENTIAL/PLATELET
Basophils Absolute: 0.1 10*3/uL (ref 0.0–0.1)
Basophils Relative: 0.7 % (ref 0.0–3.0)
Eosinophils Absolute: 0.3 10*3/uL (ref 0.0–0.7)
Eosinophils Relative: 2.8 % (ref 0.0–5.0)
HCT: 43.1 % (ref 36.0–46.0)
Hemoglobin: 14.1 g/dL (ref 12.0–15.0)
Lymphocytes Relative: 25.7 % (ref 12.0–46.0)
Lymphs Abs: 2.5 10*3/uL (ref 0.7–4.0)
MCHC: 32.8 g/dL (ref 30.0–36.0)
MCV: 85.2 fl (ref 78.0–100.0)
Monocytes Absolute: 0.6 10*3/uL (ref 0.1–1.0)
Monocytes Relative: 6.2 % (ref 3.0–12.0)
Neutro Abs: 6.4 10*3/uL (ref 1.4–7.7)
Neutrophils Relative %: 64.6 % (ref 43.0–77.0)
Platelets: 274 10*3/uL (ref 150.0–400.0)
RBC: 5.06 Mil/uL (ref 3.87–5.11)
RDW: 15.6 % — ABNORMAL HIGH (ref 11.5–15.5)
WBC: 9.9 10*3/uL (ref 4.0–10.5)

## 2020-05-30 LAB — LIPID PANEL
Cholesterol: 185 mg/dL (ref 0–200)
HDL: 73.7 mg/dL (ref >39.00)
LDL Cholesterol: 92 mg/dL (ref 0–99)
NonHDL: 111.01
Total CHOL/HDL Ratio: 3
Triglycerides: 96 mg/dL (ref 0.0–149.0)
VLDL: 19.2 mg/dL (ref 0.0–40.0)

## 2020-05-30 LAB — VITAMIN B12: Vitamin B-12: >1526 pg/mL — ABNORMAL HIGH (ref 211–911)

## 2020-05-30 NOTE — Progress Notes (Signed)
Subjective:    Patient ID: Alyssa Roy, female    DOB: December 12, 1932, 86 y.o.   MRN: [id number]  HPI  This visit occurred during the SARS-CoV-2 public health emergency.  Safety protocols were in place, including screening questions prior to the visit, additional usage of staff PPE, and extensive cleaning of exam room while observing appropriate contact time as indicated for disinfecting solutions.   Alyssa Roy is a 86 year old female with a history of hypertension, type 2 diabetes, osteoarthritis, polymyalgia, chronic venous stasis who presents today with a chief complaint of breast problem. She is also needing repeat labs done, would like to get them today.  Over the last several weeks she's noticed her right breast appearing larger than the left breast. She really noticed this when putting on her regular bra for which she doesn't typically wear. She normally wears her sports bra which compresses her breasts.   She really started to take note when she received a letter from [place] stating that her mammogram was due. She denies erythema, pain, masses, skin texture changes.   She is also needing assistance with transportation to and from appointments as her family is unavailable at times.   Wt Readings from Last 3 Encounters:  05/30/20 170 lb (77.1 kg)  04/16/20 168 lb (76.2 kg)  02/28/20 173 lb (78.5 kg)     Review of Systems  Constitutional: Negative for fever.  Genitourinary:       Right breast larger than left.  Skin: Negative for color change, rash and wound.       Past Medical History:  Diagnosis Date  . Atherosclerosis   . Chronic venous stasis   . Diabetes mellitus without complication ([place])   . GAD (generalized anxiety disorder)   . Hyperglycemia   . Hyperlipidemia   . Hypertension   . Insomnia   . Osteoarthritis   . Urinary tract infection      Social History   Socioeconomic History  . Marital status: Unknown    Spouse name: Not on file  . Number of  children: Not on file  . Years of education: Not on file  . Highest education level: Not on file  Occupational History  . Not on file  Tobacco Use  . Smoking status: Never Smoker  . Smokeless tobacco: Never Used  Substance and Sexual Activity  . Alcohol use: Not Currently  . Drug use: Not on file  . Sexual activity: Not on file  Other Topics Concern  . Not on file  Social History Narrative   Widow.   Retired.   Moved to [place] to live closer to daughter.   Social Determinants of Health   Financial Resource Strain: Low Risk   . Difficulty of Paying Living Expenses: Not hard at all  Food Insecurity: No Food Insecurity  . Worried About Charity fundraiser in the Last Year: Never true  . Ran Out of Food in the Last Year: Never true  Transportation Needs: No Transportation Needs  . Lack of Transportation (Medical): No  . Lack of Transportation (Non-Medical): No  Physical Activity: Inactive  . Days of Exercise per Week: 0 days  . Minutes of Exercise per Session: 0 min  Stress: No Stress Concern Present  . Feeling of Stress : Not at all  Social Connections:   . Frequency of Communication with Friends and Family: Not on file  . Frequency of Social Gatherings with Friends and Family: Not on file  . Attends Religious  Services: Not on file  . Active Member of Clubs or Organizations: Not on file  . Attends Archivist Meetings: Not on file  . Marital Status: Not on file  Intimate Partner Violence: Not At Risk  . Fear of Current or Ex-Partner: No  . Emotionally Abused: No  . Physically Abused: No  . Sexually Abused: No    Past Surgical History:  Procedure Laterality Date  . ABDOMINAL HYSTERECTOMY  1979  . CHOLECYSTECTOMY  1984  . REPLACEMENT TOTAL KNEE Right 2007    Family History  Problem Relation Age of Onset  . Heart disease Mother   . Arthritis Mother   . Diabetes Father     Allergies  Allergen Reactions  . Crestor [Rosuvastatin Calcium]   .  Feldene [Piroxicam]   . Keflex [Cephalexin]   . Lipitor [Atorvastatin Calcium]   . Zetia [Ezetimibe]   . Aspirin Other (See Comments)    Stomach discomfort    Current Outpatient Medications on File Prior to Visit  Medication Sig Dispense Refill  . acetaminophen (TYLENOL) 650 MG CR tablet Take 650 mg by mouth every 8 (eight) hours as needed for pain.    . Cholecalciferol (D-3-5) 125 MCG (5000 UT) capsule Take 5,000 Units by mouth daily.    Marland Kitchen co-enzyme Q-10 30 MG capsule Take 30 mg by mouth daily.    . Coenzyme Q10 (COQ10) 100 MG CAPS Take by mouth daily.     . Cyanocobalamin (B-12) 5000 MCG CAPS Take by mouth daily. Twice a week    . diclofenac Sodium (VOLTAREN) 1 % GEL Apply 2 g topically 3 (three) times daily as needed. 100 g 1  . famotidine (PEPCID) 20 MG tablet TAKE 1 TABLET (20 MG TOTAL) BY MOUTH DAILY. FOR HEARTBURN. 90 tablet 1  . gabapentin (NEURONTIN) 100 MG capsule Take by mouth.    . hydrochlorothiazide (HYDRODIURIL) 12.5 MG tablet TAKE 1 TABLET (12.5 MG TOTAL) BY MOUTH DAILY. FOR BLOOD PRESSURE AND SWELLING. 90 tablet 1  . pravastatin (PRAVACHOL) 80 MG tablet Take 1 tablet (80 mg total) by mouth every evening. For cholesterol. 90 tablet 3   No current facility-administered medications on file prior to visit.    BP 140/72   Pulse 63   Temp (!) 97.5 F (36.4 C) (Temporal)   Ht 4\' 7"  (1.397 m)   Wt 170 lb (77.1 kg)   SpO2 96%   BMI 39.51 kg/m    Objective:   Physical Exam Chest:     Comments: No masses, skin texture changes, erythema, tenderness on exam. Right breast does appear to be larger than left.  Skin:    General: Skin is warm and dry.            Assessment & Plan:

## 2020-05-30 NOTE — Assessment & Plan Note (Signed)
Referral placed to [facility] for assistance with transportation to and from appointments.

## 2020-05-30 NOTE — Assessment & Plan Note (Signed)
Compliant to pravastatin 80 mg which was changed last visit. Repeat lipids pending.

## 2020-05-30 NOTE — Assessment & Plan Note (Signed)
Unclear if this is abnormal for her, but she believes this to be true. Right breast does appear larger but is without masses or swelling.   She is due for screening mammogram, so given her changes a diagnostic mammogram with ultrasounds bilaterally will be ordered.

## 2020-05-30 NOTE — Patient Instructions (Addendum)
Call the [facility] to schedule your mammogram.   It was a pleasure to see you today!   Your appointment will at the following location  [x]   [facility]  [place] [address]  [phone]  []   [place] [facility])   [address] Room [place], [place] [address]  [phone]  []   [facility]      [address] [place], [place]         []   [facility]  [place], [place]  []  [facility]   [address], [place] [address]  []  [facility]  [address] [address] [place], [place] [address] [phone]    Make sure to wear two peace clothing  No lotions powders or deodorants the day of the appointment Make sure to bring picture ID and insurance card.  Bring list of medications you are currently taking including any supplements.

## 2020-05-30 NOTE — Assessment & Plan Note (Signed)
She has reduced the amount of oral B12.  Repeat Vitamin B12 level pending.

## 2020-05-31 ENCOUNTER — Inpatient Hospital Stay
Admission: RE | Admit: 2020-05-31 | Discharge: 2020-05-31 | Disposition: A | Discharge status: Home / Self Care | Payer: Self-pay | Source: Ambulatory Visit | Attending: *Deleted | Admitting: *Deleted

## 2020-05-31 ENCOUNTER — Other Ambulatory Visit: Payer: Self-pay | Admitting: *Deleted

## 2020-05-31 DIAGNOSIS — Z1231 Encounter for screening mammogram for malignant neoplasm of breast: Secondary | ICD-10-CM

## 2020-06-13 ENCOUNTER — Telehealth: Payer: Self-pay | Admitting: Primary Care

## 2020-06-13 NOTE — Telephone Encounter (Signed)
Patient called requesting we mail her lab results. She is also stating that she received a call about transportation. Do we know anything about this? Patient states we called her daughter in law. Not sure if this was [place]? EM

## 2020-06-13 NOTE — Telephone Encounter (Signed)
Alyssa Roy, can you mail results? I believe you handed her something regarding transportation to and from appointments during her last visit?

## 2020-06-17 ENCOUNTER — Encounter: Payer: Self-pay | Admitting: Primary Care

## 2020-06-17 ENCOUNTER — Other Ambulatory Visit: Payer: Self-pay

## 2020-06-17 NOTE — Telephone Encounter (Signed)
Results mailed 

## 2020-06-24 ENCOUNTER — Telehealth: Payer: Self-pay | Admitting: Primary Care

## 2020-06-24 ENCOUNTER — Ambulatory Visit
Admission: RE | Admit: 2020-06-24 | Discharge: 2020-06-24 | Disposition: A | Discharge status: Home / Self Care | Payer: Medicare Other | Source: Ambulatory Visit | Attending: Primary Care | Admitting: Primary Care

## 2020-06-24 ENCOUNTER — Other Ambulatory Visit: Payer: Self-pay

## 2020-06-24 DIAGNOSIS — N6459 Other signs and symptoms in breast: Secondary | ICD-10-CM

## 2020-06-24 DIAGNOSIS — R928 Other abnormal and inconclusive findings on diagnostic imaging of breast: Secondary | ICD-10-CM | POA: Diagnosis not present

## 2020-06-24 NOTE — Telephone Encounter (Signed)
   SF 06/24/2020   Name: Kathia Covington   MRN: [id number]   DOB: 1933-02-24   AGE: 86 y.o.   GENDER: female   PCP Pleas Koch, NP.   Referral Reason: Transportation Needs   Interventions: Unsuccessful outbound call placed to the patient. A HIPPA compliant voice message left requesting a return call  Follow up plan: Care guide will follow up with patient by phone over the next week.    [place], Care Management Phone: [phone] Email: [email or web address]

## 2020-07-03 IMAGING — DX DG HAND COMPLETE 3+V*R*
3 series · 3 of 3 positions shown · non-contrast
Comparison: Left wrist series of April 23, 2018

CLINICAL DATA: Osteoarthritis by history. Increased swelling of the
digits

EXAM:
LEFT HAND - COMPLETE 3+ VIEW; RIGHT HAND - COMPLETE 3+ VIEW

[hand ap]
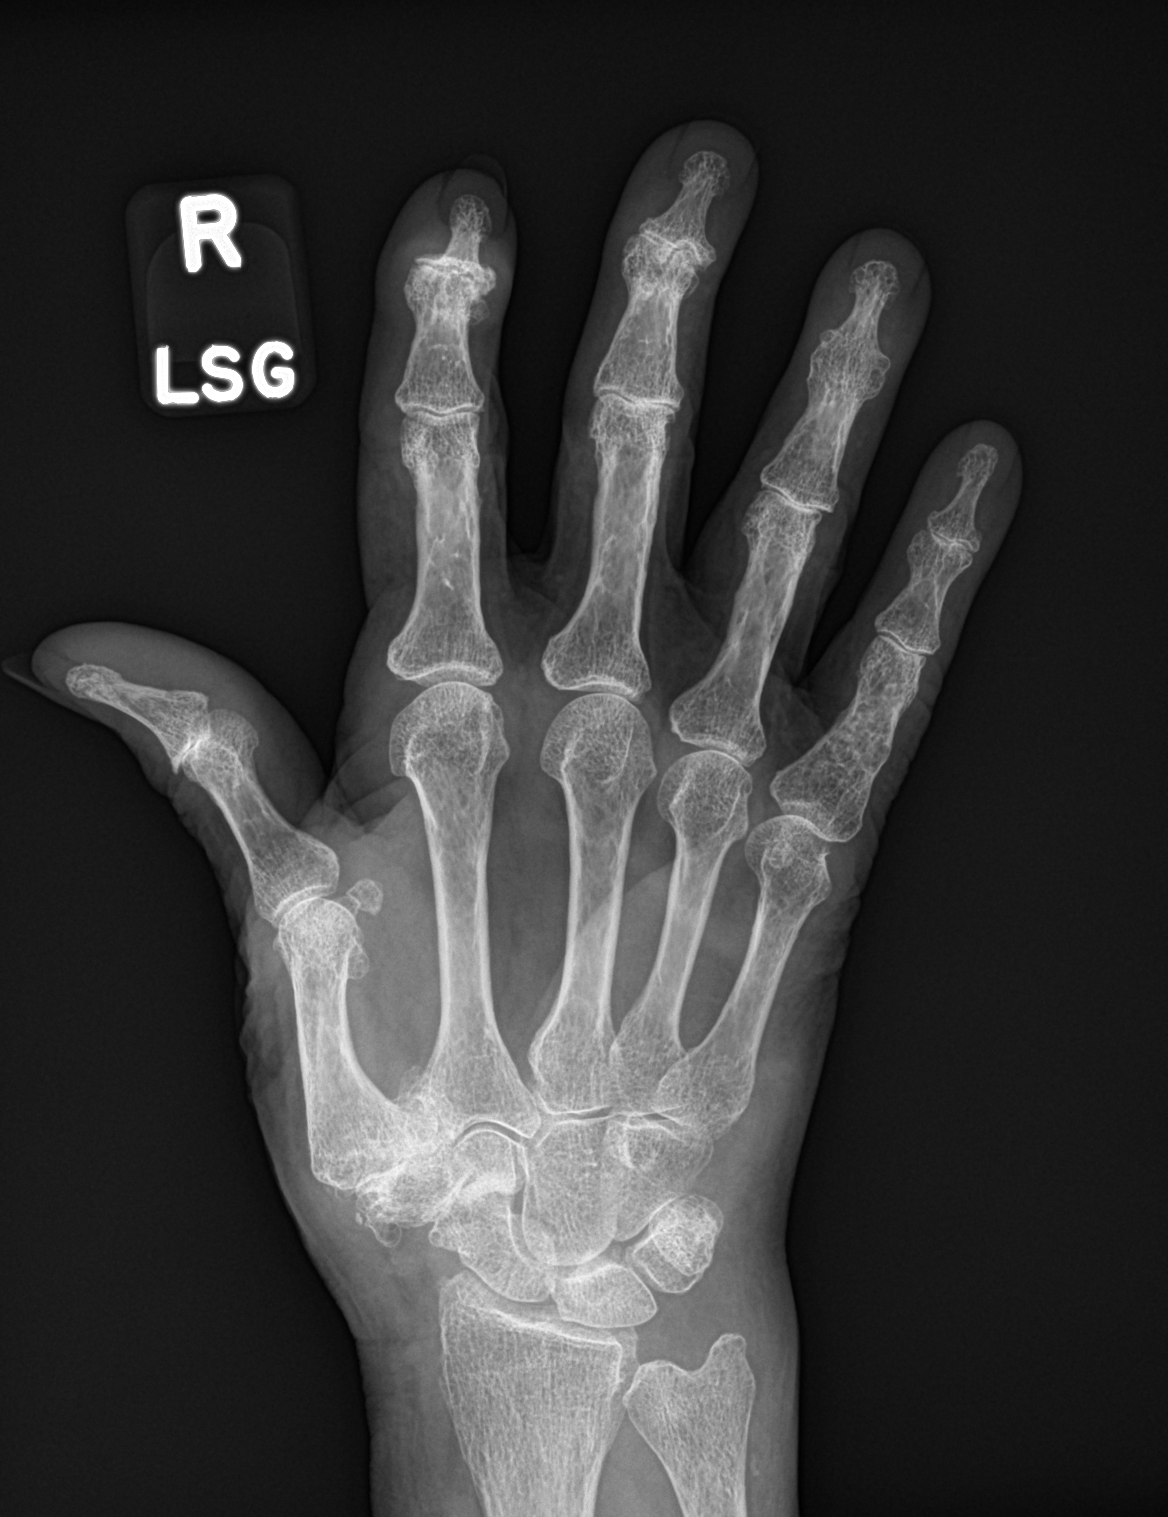

[hand obl]
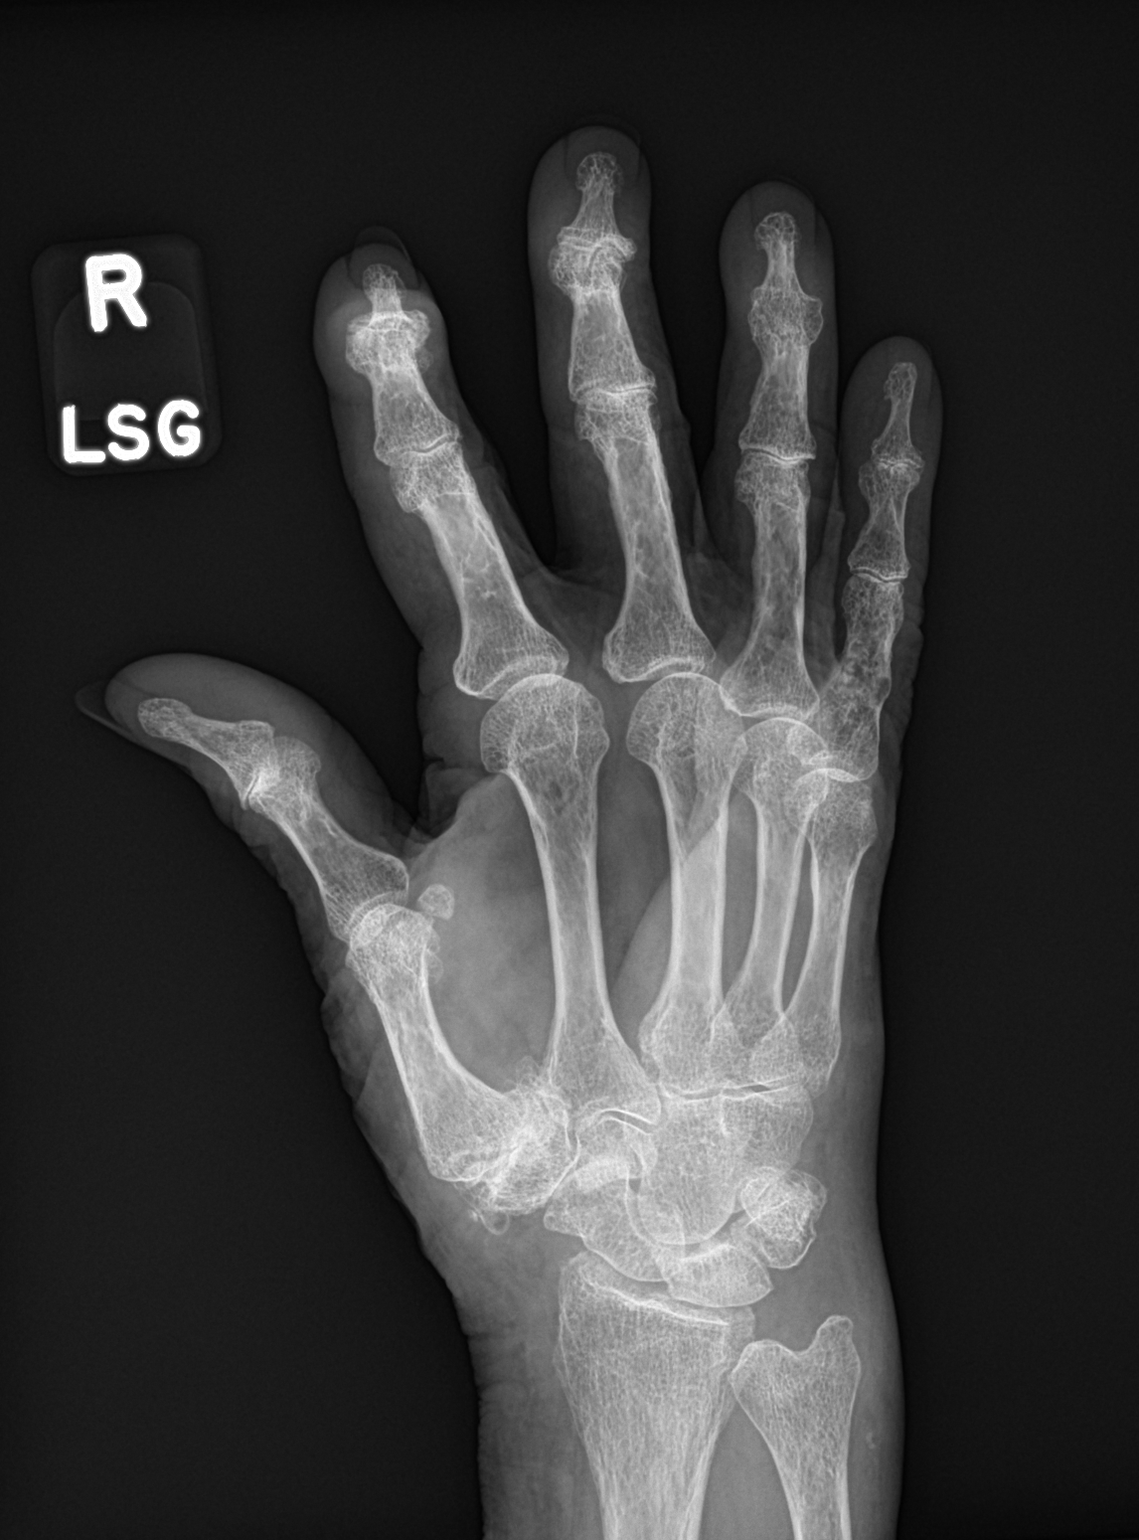

[hand lat]
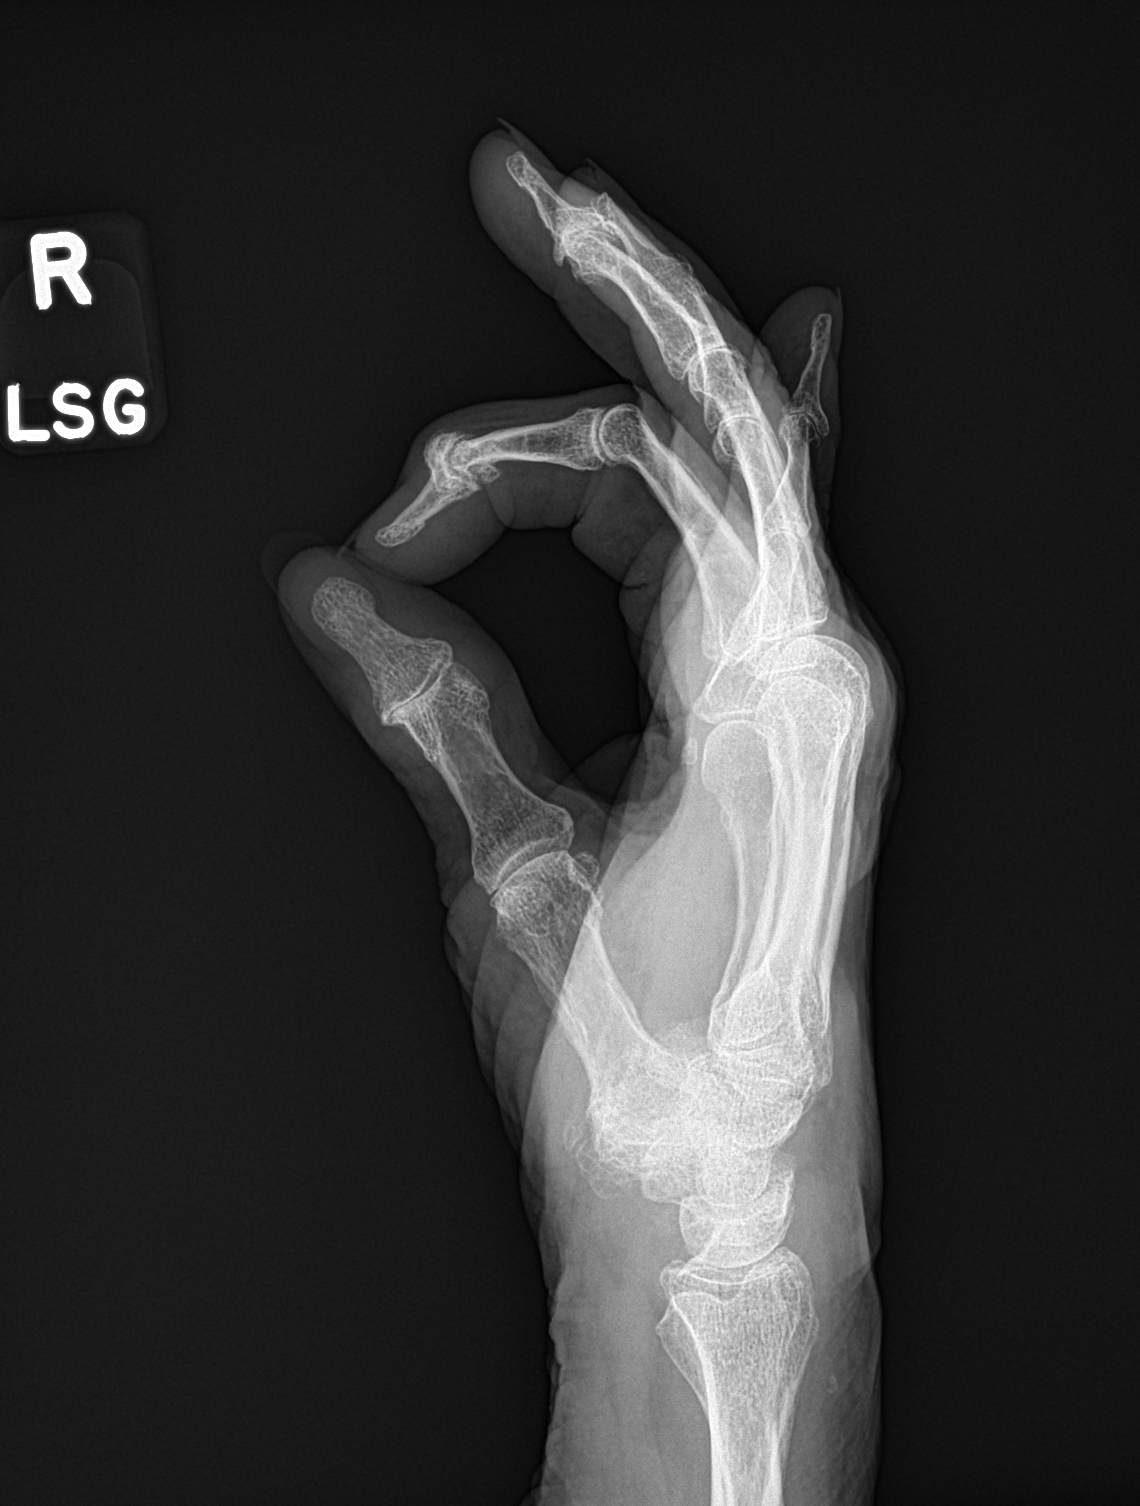

[3 of 3 positions shown; findings below may reference images not displayed]

FINDINGS: The bones are subjectively diffusely osteopenic. There is goal wing
deformity of the second, third, and fifth DIP joints of the left
hand and second and third DIP joints of the right hand. There is
fusion across the DIP joint of the fourth fingers bilaterally. There
is moderate joint space loss of the PIP joints of the second through
fifth digits bilaterally. The IP joints of the thumbs exhibit mild
to moderate narrowing greatest on the right. There is narrowing of
the fourth MCP joint spaces bilaterally. There is severe joint space
loss and articular surface eburnation of the first CMC joints
bilaterally as well as degenerative change of the articulation of
the trapezium with the trapezoid. No erosive changes are observed.
There is mild soft tissue swelling of the digits bilaterally. There
is no acute or healing fracture. There is old deformity of the shaft
of the proximal phalanx of the right fifth finger consistent with
previous fracture.
IMPRESSION: Moderate to severe osteoarthritic changes of the interphalangeal,
MCP, and CMC joints as described. No acute fracture nor dislocation.

## 2020-07-03 IMAGING — DX DG HAND COMPLETE 3+V*L*
3 series · 3 of 3 positions shown · non-contrast
Comparison: Left wrist series of April 23, 2018

CLINICAL DATA: Osteoarthritis by history. Increased swelling of the
digits

EXAM:
LEFT HAND - COMPLETE 3+ VIEW; RIGHT HAND - COMPLETE 3+ VIEW

[hand ap]
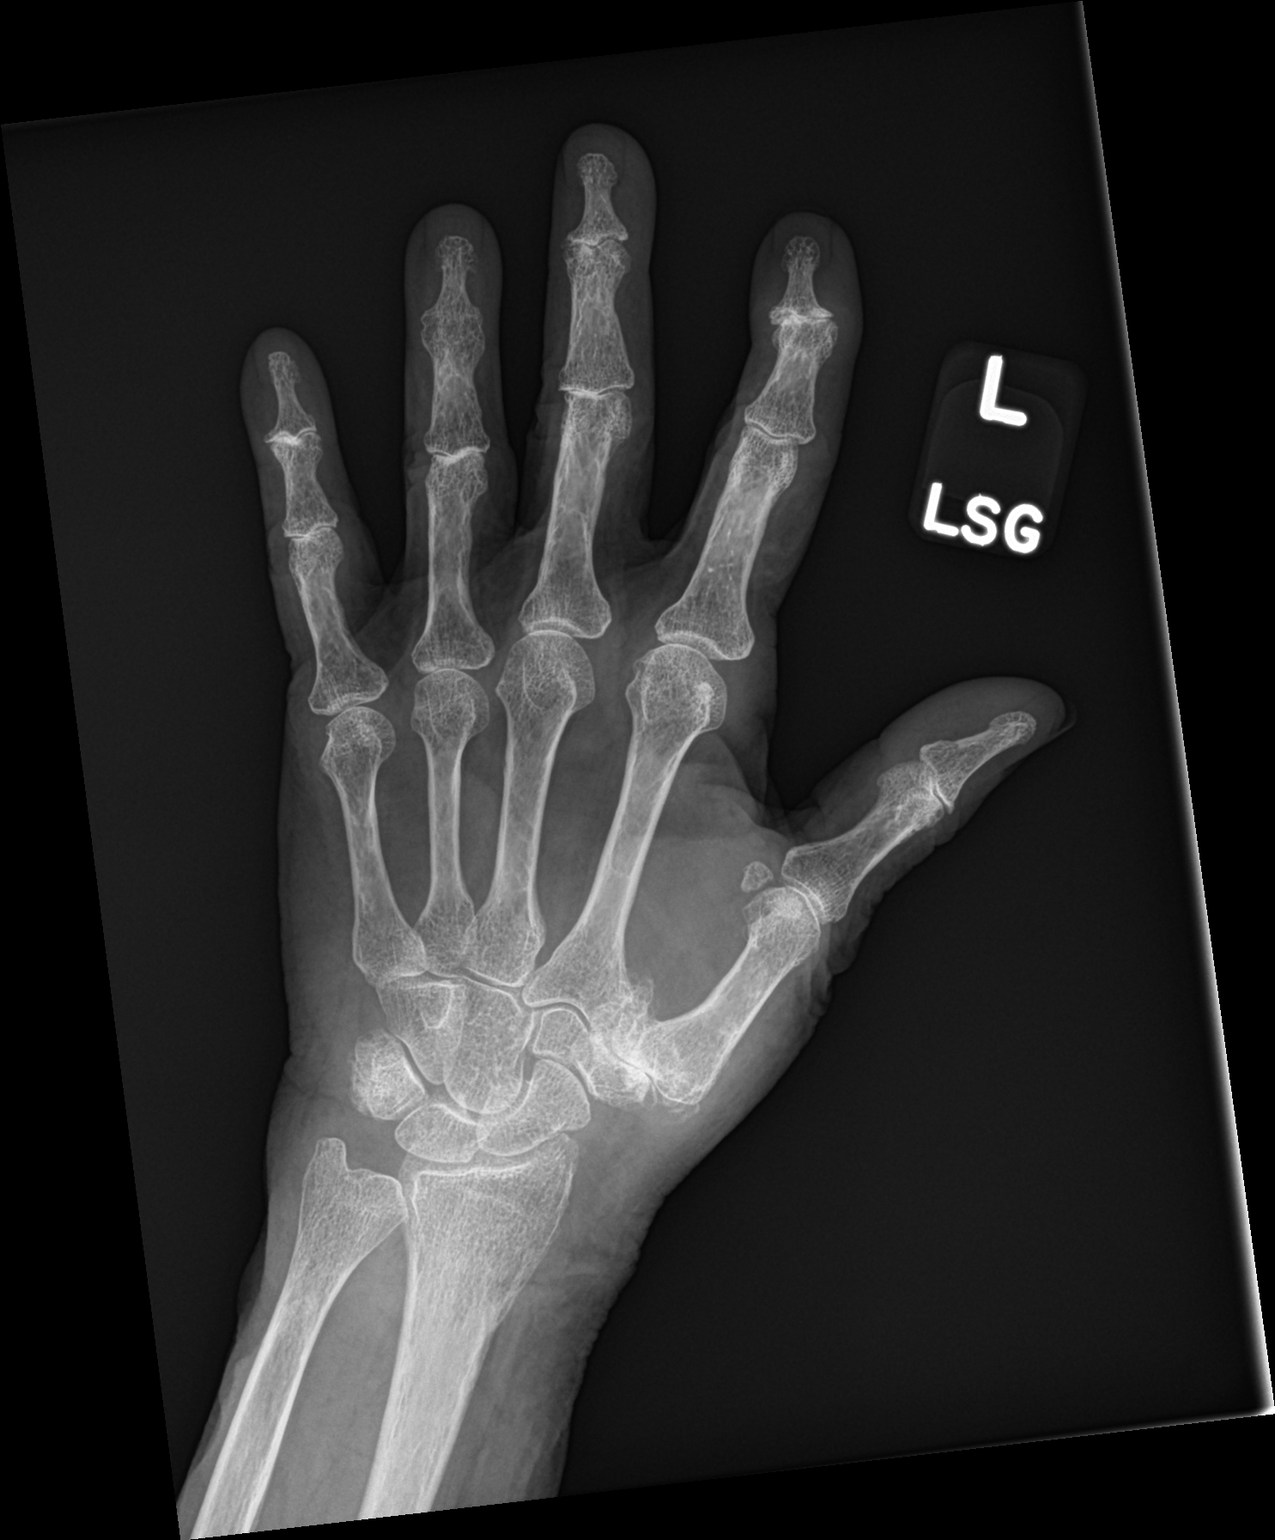

[hand obl]
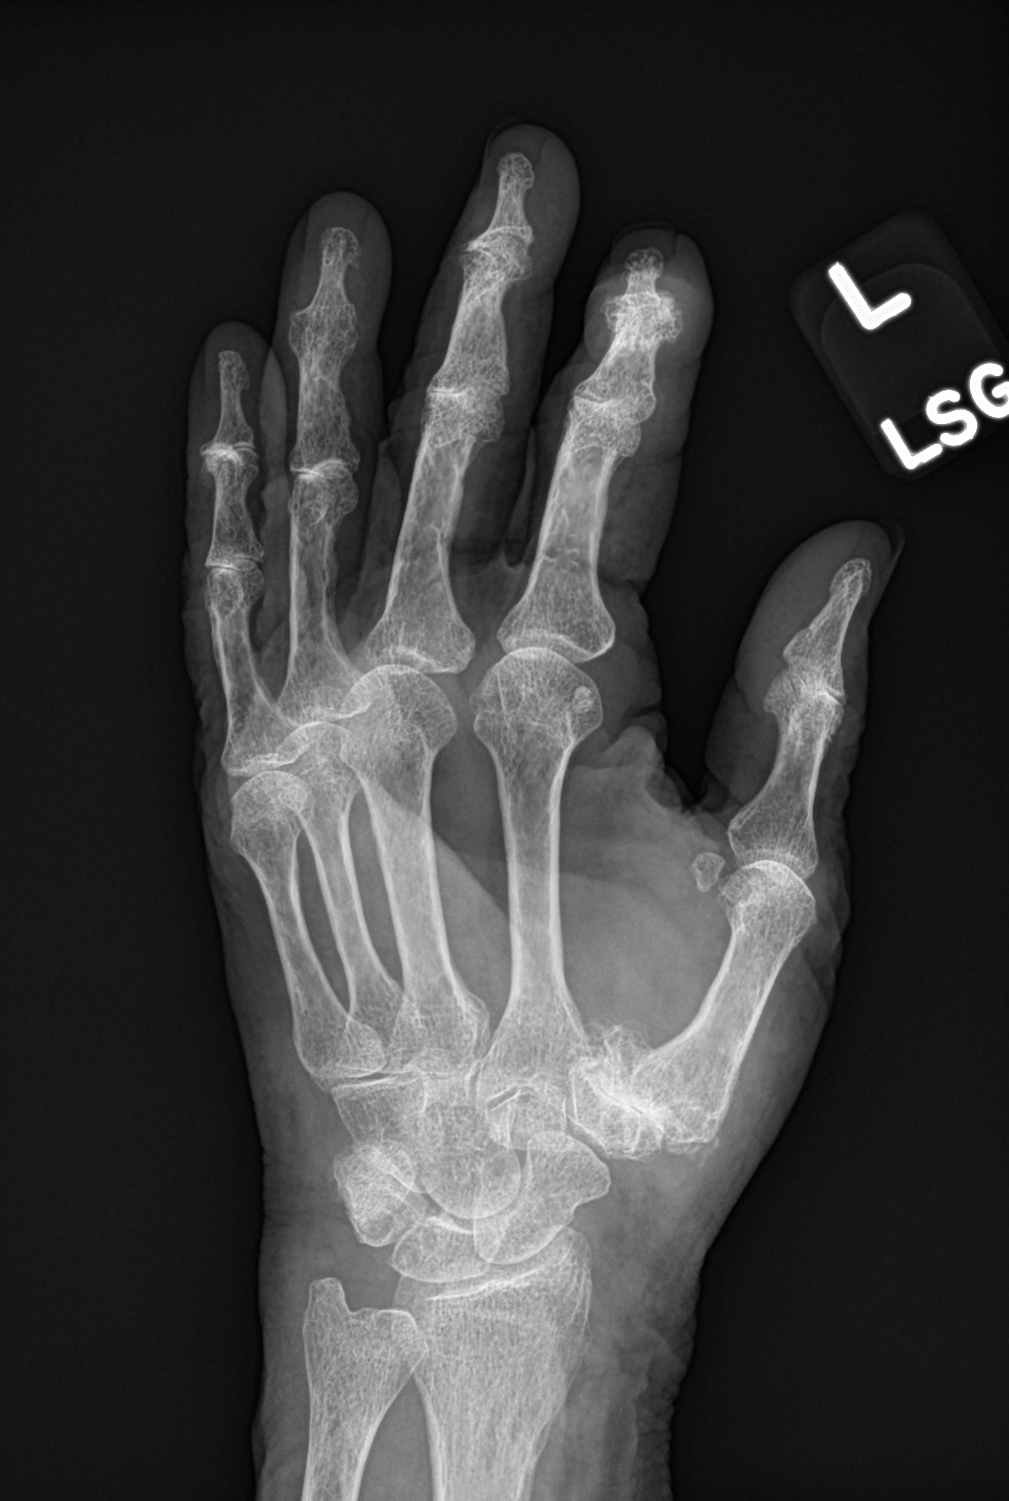

[hand lat]
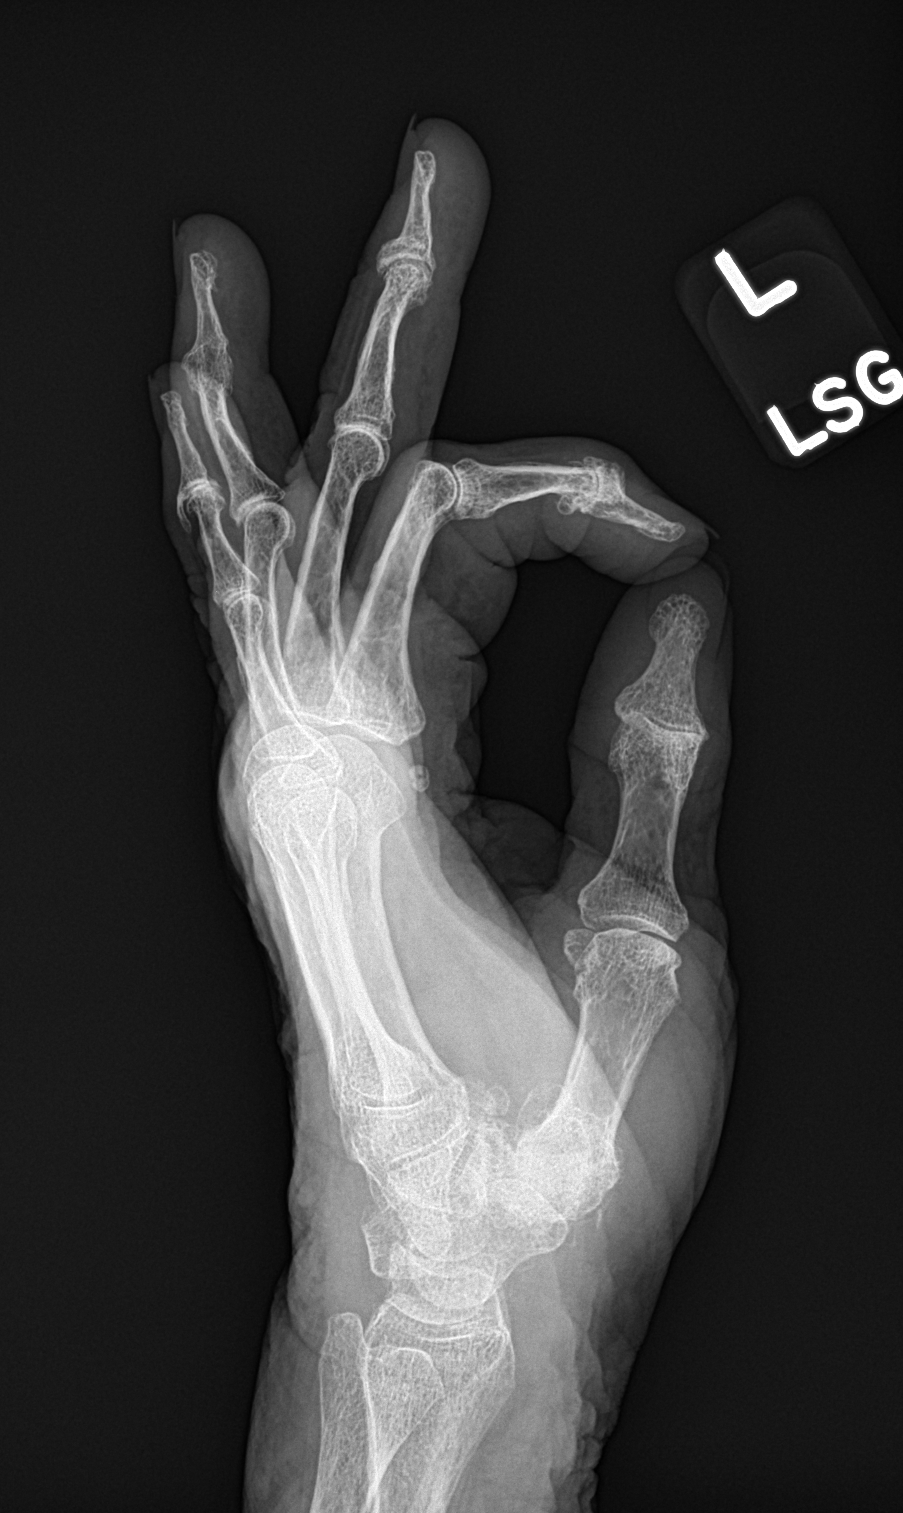

[3 of 3 positions shown; findings below may reference images not displayed]

FINDINGS: The bones are subjectively diffusely osteopenic. There is goal wing
deformity of the second, third, and fifth DIP joints of the left
hand and second and third DIP joints of the right hand. There is
fusion across the DIP joint of the fourth fingers bilaterally. There
is moderate joint space loss of the PIP joints of the second through
fifth digits bilaterally. The IP joints of the thumbs exhibit mild
to moderate narrowing greatest on the right. There is narrowing of
the fourth MCP joint spaces bilaterally. There is severe joint space
loss and articular surface eburnation of the first CMC joints
bilaterally as well as degenerative change of the articulation of
the trapezium with the trapezoid. No erosive changes are observed.
There is mild soft tissue swelling of the digits bilaterally. There
is no acute or healing fracture. There is old deformity of the shaft
of the proximal phalanx of the right fifth finger consistent with
previous fracture.
IMPRESSION: Moderate to severe osteoarthritic changes of the interphalangeal,
MCP, and CMC joints as described. No acute fracture nor dislocation.

## 2020-08-05 ENCOUNTER — Telehealth: Payer: Self-pay | Admitting: *Deleted

## 2020-08-05 NOTE — Telephone Encounter (Signed)
Patient called stating that she started with an earache yesterday. Patient stated that the ache is only in her right ear. Patient denies a fever, cough, sore throat or any other symptoms. Patient stated that she has tried olive oil which has not helped. Patient wants to know what Allie Bossier NP would recommend and if there is anything OTC that she should try. Patient stated that she has no transportation to the office and her family is getting over covid. Patient stated that she has not been around her family that has had covid. Pharmacy [facility]

## 2020-08-05 NOTE — Telephone Encounter (Signed)
For any earache I recommend Tylenol 500 mg every 8 hours as needed or ibuprofen 600 mg every 8 hours as needed.  If she feels fluid or fullness in the ear she could try Flonase nasal spray.

## 2020-08-06 NOTE — Telephone Encounter (Signed)
Called patient let her know information. She has been taking tylenol. She will try the Flonase.

## 2020-09-11 ENCOUNTER — Other Ambulatory Visit: Payer: Self-pay | Admitting: Primary Care

## 2020-09-11 DIAGNOSIS — K219 Gastro-esophageal reflux disease without esophagitis: Secondary | ICD-10-CM

## 2020-09-17 ENCOUNTER — Telehealth: Payer: Self-pay | Admitting: Primary Care

## 2020-09-17 NOTE — Telephone Encounter (Signed)
LVM  For pt to rtn my call to R/S appt with NHA on 11/04/20

## 2020-09-26 DIAGNOSIS — M65342 Trigger finger, left ring finger: Secondary | ICD-10-CM | POA: Diagnosis not present

## 2020-09-26 DIAGNOSIS — M353 Polymyalgia rheumatica: Secondary | ICD-10-CM | POA: Diagnosis not present

## 2020-09-26 DIAGNOSIS — M8949 Other hypertrophic osteoarthropathy, multiple sites: Secondary | ICD-10-CM | POA: Diagnosis not present

## 2020-10-17 ENCOUNTER — Other Ambulatory Visit: Payer: Self-pay | Admitting: Primary Care

## 2020-10-17 DIAGNOSIS — R600 Localized edema: Secondary | ICD-10-CM

## 2020-10-17 DIAGNOSIS — E785 Hyperlipidemia, unspecified: Secondary | ICD-10-CM

## 2020-10-17 DIAGNOSIS — I10 Essential (primary) hypertension: Secondary | ICD-10-CM

## 2020-10-17 DIAGNOSIS — I7090 Unspecified atherosclerosis: Secondary | ICD-10-CM

## 2020-10-20 NOTE — Telephone Encounter (Signed)
Refill(s) sent to pharmacy for HCTZ, no longer on pravastatin 40, is on 80 mg now

## 2020-11-04 ENCOUNTER — Ambulatory Visit: Payer: Medicare Other

## 2020-12-04 ENCOUNTER — Other Ambulatory Visit: Payer: Self-pay

## 2020-12-04 ENCOUNTER — Ambulatory Visit (INDEPENDENT_AMBULATORY_CARE_PROVIDER_SITE_OTHER): Payer: Medicare Other

## 2020-12-04 ENCOUNTER — Ambulatory Visit: Payer: Medicare Other

## 2020-12-04 ENCOUNTER — Telehealth: Payer: Self-pay

## 2020-12-04 DIAGNOSIS — Z0000 Encounter for general adult medical examination without abnormal findings: Secondary | ICD-10-CM | POA: Diagnosis not present

## 2020-12-04 NOTE — Patient Instructions (Signed)
Ms. Alyssa Roy , Thank you for taking time to come for your Medicare Wellness Visit. I appreciate your ongoing commitment to your health goals. Please review the following plan we discussed and let me know if I can assist you in the future.   Screening recommendations/referrals: Colonoscopy: no longer required  Mammogram: Up to date, completed 06/24/2020, due 06/2021 Bone Density: declined Recommended yearly ophthalmology/optometry visit for glaucoma screening and checkup Recommended yearly dental visit for hygiene and checkup  Vaccinations: Influenza vaccine: Up to date, completed 04/16/2020, due 02/2021 Pneumococcal vaccine: Completed series Tdap vaccine: Up to date, completed 07/11/2012, due 06/2022 Shingles vaccine: completed 04/12/2018, #2 is due. Patient declined    Covid-19:3 doses completed. Booster due. Please get at earliest convenience.  Advanced directives: Please bring a copy of your POA (Power of Attorney) and/or Living Will to your next appointment.   Conditions/risks identified: diabetes, hyperlipidemia   Next appointment: Follow up in one year for your annual wellness visit    Preventive Care 5 Years and Older, Female Preventive care refers to lifestyle choices and visits with your health care provider that can promote health and wellness. What does preventive care include?  A yearly physical exam. This is also called an annual well check.  Dental exams once or twice a year.  Routine eye exams. Ask your health care provider how often you should have your eyes checked.  Personal lifestyle choices, including:  Daily care of your teeth and gums.  Regular physical activity.  Eating a healthy diet.  Avoiding tobacco and drug use.  Limiting alcohol use.  Practicing safe sex.  Taking low-dose aspirin every day.  Taking vitamin and mineral supplements as recommended by your health care provider. What happens during an annual well check? The services and  screenings done by your health care provider during your annual well check will depend on your age, overall health, lifestyle risk factors, and family history of disease. Counseling  Your health care provider may ask you questions about your:  Alcohol use.  Tobacco use.  Drug use.  Emotional well-being.  Home and relationship well-being.  Sexual activity.  Eating habits.  History of falls.  Memory and ability to understand (cognition).  Work and work Statistician.  Reproductive health. Screening  You may have the following tests or measurements:  Height, weight, and BMI.  Blood pressure.  Lipid and cholesterol levels. These may be checked every 5 years, or more frequently if you are over 31 years old.  Skin check.  Lung cancer screening. You may have this screening every year starting at age 105 if you have a 30-pack-year history of smoking and currently smoke or have quit within the past 15 years.  Fecal occult blood test (FOBT) of the stool. You may have this test every year starting at age 85.  Flexible sigmoidoscopy or colonoscopy. You may have a sigmoidoscopy every 5 years or a colonoscopy every 10 years starting at age 27.  Hepatitis C blood test.  Hepatitis B blood test.  Sexually transmitted disease (STD) testing.  Diabetes screening. This is done by checking your blood sugar (glucose) after you have not eaten for a while (fasting). You may have this done every 1-3 years.  Bone density scan. This is done to screen for osteoporosis. You may have this done starting at age 22.  Mammogram. This may be done every 1-2 years. Talk to your health care provider about how often you should have regular mammograms. Talk with your health care provider about  your test results, treatment options, and if necessary, the need for more tests. Vaccines  Your health care provider may recommend certain vaccines, such as:  Influenza vaccine. This is recommended every  year.  Tetanus, diphtheria, and acellular pertussis (Tdap, Td) vaccine. You may need a Td booster every 10 years.  Zoster vaccine. You may need this after age 49.  Pneumococcal 13-valent conjugate (PCV13) vaccine. One dose is recommended after age 76.  Pneumococcal polysaccharide (PPSV23) vaccine. One dose is recommended after age 23. Talk to your health care provider about which screenings and vaccines you need and how often you need them. This information is not intended to replace advice given to you by your health care provider. Make sure you discuss any questions you have with your health care provider. Document Released: 07/26/2015 Document Revised: 03/18/2016 Document Reviewed: 04/30/2015 Elsevier Interactive Patient Education  2017 [place] Prevention in the Home Falls can cause injuries. They can happen to people of all ages. There are many things you can do to make your home safe and to help prevent falls. What can I do on the outside of my home?  Regularly fix the edges of walkways and driveways and fix any cracks.  Remove anything that might make you trip as you walk through a door, such as a raised step or threshold.  Trim any bushes or trees on the path to your home.  Use bright outdoor lighting.  Clear any walking paths of anything that might make someone trip, such as rocks or tools.  Regularly check to see if handrails are loose or broken. Make sure that both sides of any steps have handrails.  Any raised decks and porches should have guardrails on the edges.  Have any leaves, snow, or ice cleared regularly.  Use sand or salt on walking paths during winter.  Clean up any spills in your garage right away. This includes oil or grease spills. What can I do in the bathroom?  Use night lights.  Install grab bars by the toilet and in the tub and shower. Do not use towel bars as grab bars.  Use non-skid mats or decals in the tub or shower.  If you  need to sit down in the shower, use a plastic, non-slip stool.  Keep the floor dry. Clean up any water that spills on the floor as soon as it happens.  Remove soap buildup in the tub or shower regularly.  Attach bath mats securely with double-sided non-slip rug tape.  Do not have throw rugs and other things on the floor that can make you trip. What can I do in the bedroom?  Use night lights.  Make sure that you have a light by your bed that is easy to reach.  Do not use any sheets or blankets that are too big for your bed. They should not hang down onto the floor.  Have a firm chair that has side arms. You can use this for support while you get dressed.  Do not have throw rugs and other things on the floor that can make you trip. What can I do in the kitchen?  Clean up any spills right away.  Avoid walking on wet floors.  Keep items that you use a lot in easy-to-reach places.  If you need to reach something above you, use a strong step stool that has a grab bar.  Keep electrical cords out of the way.  Do not use floor polish or wax  that makes floors slippery. If you must use wax, use non-skid floor wax.  Do not have throw rugs and other things on the floor that can make you trip. What can I do with my stairs?  Do not leave any items on the stairs.  Make sure that there are handrails on both sides of the stairs and use them. Fix handrails that are broken or loose. Make sure that handrails are as long as the stairways.  Check any carpeting to make sure that it is firmly attached to the stairs. Fix any carpet that is loose or worn.  Avoid having throw rugs at the top or bottom of the stairs. If you do have throw rugs, attach them to the floor with carpet tape.  Make sure that you have a light switch at the top of the stairs and the bottom of the stairs. If you do not have them, ask someone to add them for you. What else can I do to help prevent falls?  Wear shoes  that:  Do not have high heels.  Have rubber bottoms.  Are comfortable and fit you well.  Are closed at the toe. Do not wear sandals.  If you use a stepladder:  Make sure that it is fully opened. Do not climb a closed stepladder.  Make sure that both sides of the stepladder are locked into place.  Ask someone to hold it for you, if possible.  Clearly mark and make sure that you can see:  Any grab bars or handrails.  First and last steps.  Where the edge of each step is.  Use tools that help you move around (mobility aids) if they are needed. These include:  Canes.  Walkers.  Scooters.  Crutches.  Turn on the lights when you go into a dark area. Replace any light bulbs as soon as they burn out.  Set up your furniture so you have a clear path. Avoid moving your furniture around.  If any of your floors are uneven, fix them.  If there are any pets around you, be aware of where they are.  Review your medicines with your doctor. Some medicines can make you feel dizzy. This can increase your chance of falling. Ask your doctor what other things that you can do to help prevent falls. This information is not intended to replace advice given to you by your health care provider. Make sure you discuss any questions you have with your health care provider. Document Released: 04/25/2009 Document Revised: 12/05/2015 Document Reviewed: 08/03/2014 Elsevier Interactive Patient Education  2017 [employer].

## 2020-12-04 NOTE — Progress Notes (Signed)
PCP notes:  Health Maintenance: Covid booster- due dexa- declined  Eye exam- due Foot exam- due    Abnormal Screenings: none   Patient concerns: Dizziness, balance issues    Nurse concerns: none   Next PCP appt.: 12/18/2020 @ 9 am

## 2020-12-04 NOTE — Progress Notes (Signed)
Subjective:   Alyssa Roy is a 87 y.o. female who presents for Medicare Annual (Subsequent) preventive examination.  Review of Systems: N/A      I connected with the patient today by telephone and verified that I am speaking with the correct person using two identifiers. Location patient: home Location nurse: work Persons participating in the telephone visit: patient, nurse.   I discussed the limitations, risks, security and privacy concerns of performing an evaluation and management service by telephone and the availability of in person appointments. I also discussed with the patient that there may be a patient responsible charge related to this service. The patient expressed understanding and verbally consented to this telephonic visit.        Cardiac Risk Factors include: advanced age (>71men, >49 women);diabetes mellitus;Other (see comment), Risk factor comments: hyperlipidemia     Objective:    Today's Vitals   There is no height or weight on file to calculate BMI.  Advanced Directives 12/04/2020 11/02/2019 04/23/2018  Does Patient Have a Medical Advance Directive? - Yes Yes  Type of Paramedic of [place];Living will [place];Living will [place];Living will  Does patient want to make changes to medical advance directive? - - No - Patient declined  Copy of [place] in Chart? No - copy requested No - copy requested No - copy requested    Current Medications (verified) Outpatient Encounter Medications as of 12/04/2020  Medication Sig  . acetaminophen (TYLENOL) 650 MG CR tablet Take 650 mg by mouth every 8 (eight) hours as needed for pain.  . Cholecalciferol (D-3-5) 125 MCG (5000 UT) capsule Take 5,000 Units by mouth daily.  Marland Kitchen co-enzyme Q-10 30 MG capsule Take 30 mg by mouth daily.  . Coenzyme Q10 (COQ10) 100 MG CAPS Take by mouth daily.   . Cyanocobalamin (B-12) 5000 MCG CAPS Take by  mouth daily. Twice a week  . diclofenac Sodium (VOLTAREN) 1 % GEL Apply 2 g topically 3 (three) times daily as needed.  . famotidine (PEPCID) 20 MG tablet TAKE 1 TABLET (20 MG TOTAL) BY MOUTH DAILY. FOR HEARTBURN.  . gabapentin (NEURONTIN) 100 MG capsule Take by mouth.  . hydrochlorothiazide (HYDRODIURIL) 12.5 MG tablet TAKE 1 TABLET (12.5 MG TOTAL) BY MOUTH DAILY. FOR BLOOD PRESSURE AND SWELLING.  . pravastatin (PRAVACHOL) 80 MG tablet Take 1 tablet (80 mg total) by mouth every evening. For cholesterol.   No facility-administered encounter medications on file as of 12/04/2020.    Allergies (verified) Crestor [rosuvastatin calcium], Feldene [piroxicam], Keflex [cephalexin], Lipitor [atorvastatin calcium], Zetia [ezetimibe], and Aspirin   History: Past Medical History:  Diagnosis Date  . Atherosclerosis   . Chronic venous stasis   . Diabetes mellitus without complication ([place])   . GAD (generalized anxiety disorder)   . Hyperglycemia   . Hyperlipidemia   . Hypertension   . Insomnia   . Osteoarthritis   . Urinary tract infection    Past Surgical History:  Procedure Laterality Date  . ABDOMINAL HYSTERECTOMY  1979  . BREAST BIOPSY Right    neg early 2000's  . CHOLECYSTECTOMY  1984  . REPLACEMENT TOTAL KNEE Right 2007   Family History  Problem Relation Age of Onset  . Heart disease Mother   . Arthritis Mother   . Diabetes Father   . Breast cancer Daughter 44   Social History   Socioeconomic History  . Marital status: Unknown    Spouse name: Not on file  .  Number of children: Not on file  . Years of education: Not on file  . Highest education level: Not on file  Occupational History  . Not on file  Tobacco Use  . Smoking status: Never Smoker  . Smokeless tobacco: Never Used  Substance and Sexual Activity  . Alcohol use: Not Currently  . Drug use: Not on file  . Sexual activity: Not on file  Other Topics Concern  . Not on file  Social History Narrative   Widow.    Retired.   Moved to [place] to live closer to daughter.   Social Determinants of Health   Financial Resource Strain: Low Risk   . Difficulty of Paying Living Expenses: Not hard at all  Food Insecurity: No Food Insecurity  . Worried About Charity fundraiser in the Last Year: Never true  . Ran Out of Food in the Last Year: Never true  Transportation Needs: No Transportation Needs  . Lack of Transportation (Medical): No  . Lack of Transportation (Non-Medical): No  Physical Activity: Inactive  . Days of Exercise per Week: 0 days  . Minutes of Exercise per Session: 0 min  Stress: No Stress Concern Present  . Feeling of Stress : Not at all  Social Connections: Not on file    Tobacco Counseling Counseling given: Not Answered   Clinical Intake:  Pre-visit preparation completed: Yes  Pain : 0-10 Pain Type: Chronic pain Pain Location:  (all over body) Pain Descriptors / Indicators: Aching Pain Onset: More than a month ago Pain Frequency: Intermittent     Nutritional Risks: None Diabetes: Yes CBG done?: No Did pt. bring in CBG monitor from home?: No  How often do you need to have someone help you when you read instructions, pamphlets, or other written materials from your doctor or pharmacy?: 1 - Never  Diabetic: Yes Nutrition Risk Assessment:  Has the patient had any N/V/D within the last 2 months?  No  Does the patient have any non-healing wounds?  No  Has the patient had any unintentional weight loss or weight gain?  No   Diabetes:  Is the patient diabetic?  Yes  If diabetic, was a CBG obtained today?  No  telephone visit  Did the patient bring in their glucometer from home?  No  telephone visit  How often do you monitor your CBG's? When needed.   Financial Strains and Diabetes Management:  Are you having any financial strains with the device, your supplies or your medication? No .  Does the patient want to be seen by Chronic Care Management for management of  their diabetes?  No  Would the patient like to be referred to a Nutritionist or for Diabetic Management?  No   Diabetic Exams:  Diabetic Eye Exam: Overdue for diabetic eye exam. Pt has been advised about the importance in completing this exam. Patient advised to call and schedule an eye exam. Diabetic Foot Exam: Overdue, Pt has been advised about the importance in completing this exam. Pt is scheduled for diabetic foot exam on 12/18/2020.   Interpreter Needed?: No  Information entered by :: CJohnson, LPN   Activities of Daily Living In your present state of health, do you have any difficulty performing the following activities: 12/04/2020  Hearing? Y  Comment wears hearing aids  Vision? Y  Comment some vision issues  Difficulty concentrating or making decisions? N  Walking or climbing stairs? N  Dressing or bathing? N  Doing errands, shopping? N  Preparing Food and eating ? N  Using the Toilet? N  In the past six months, have you accidently leaked urine? N  Do you have problems with loss of bowel control? N  Managing your Medications? N  Managing your Finances? N  Housekeeping or managing your Housekeeping? N  Some recent data might be hidden    Patient Care Team: Pleas Koch, NP as PCP - General (Internal Medicine)  Indicate any recent Medical Services you may have received from other than [facility] providers in the past year (date may be approximate).     Assessment:   This is a routine wellness examination for [place].  Hearing/Vision screen  Hearing Screening   125Hz  250Hz  500Hz  1000Hz  2000Hz  3000Hz  4000Hz  6000Hz  8000Hz   Right ear:           Left ear:           Vision Screening Comments: Patient gets annual eye exams  Dietary issues and exercise activities discussed: Current Exercise Habits: The patient does not participate in regular exercise at present, Exercise limited by: None identified  Goals Addressed            This Visit's Progress   . Patient  Stated       12/04/2020, I will maintain and continue medications as prescribed.      Depression Screen PHQ 2/9 Scores 12/04/2020 11/02/2019  PHQ - 2 Score 0 0  PHQ- 9 Score 0 0    Fall Risk Fall Risk  12/04/2020 11/02/2019  Falls in the past year? 1 0  Number falls in past yr: 0 0  Injury with Fall? 0 0  Risk for fall due to : Medication side effect;Impaired balance/gait Medication side effect  Follow up Falls evaluation completed;Falls prevention discussed Falls evaluation completed;Falls prevention discussed    FALL RISK PREVENTION PERTAINING TO THE HOME:  Any stairs in or around the home? Yes  If so, are there any without handrails? No  Home free of loose throw rugs in walkways, pet beds, electrical cords, etc? Yes  Adequate lighting in your home to reduce risk of falls? Yes   ASSISTIVE DEVICES UTILIZED TO PREVENT FALLS:  Life alert? No  Use of a cane, walker or w/c? Yes Grab bars in the bathroom? No  Shower chair or bench in shower? No  Elevated toilet seat or a handicapped toilet? No   TIMED UP AND GO:  Was the test performed? N/A telephone visit .    Cognitive Function: MMSE - Mini Mental State Exam 12/04/2020 11/02/2019  Orientation to time 5 5  Orientation to Place 5 5  Registration 3 3  Attention/ Calculation 5 5  Recall 3 3  Language- repeat 1 1  Mini Cog  Mini-Cog screen was completed. Maximum score is 22. A value of 0 denotes this part of the MMSE was not completed or the patient failed this part of the Mini-Cog screening.       Immunizations Immunization History  Administered Date(s) Administered  . Influenza, High Dose Seasonal PF 04/12/2018, 04/12/2019  . Influenza-Unspecified 04/01/2017, 04/16/2020  . PFIZER(Purple Top)SARS-COV-2 Vaccination 09/04/2019, 09/25/2019, 04/15/2020  . Pneumococcal Conjugate-13 09/12/2015  . Pneumococcal Polysaccharide-23 07/14/1999, 09/28/2017  . Tdap 07/11/2012  . Zoster 07/22/2009  . Zoster Recombinat (Shingrix)  04/12/2018    TDAP status: Up to date  Flu Vaccine status: Up to date  Pneumococcal vaccine status: Up to date  Covid-19 vaccine status: completed 3 doses, booster due. Patient is aware.   Qualifies for  Shingles Vaccine? Yes   Zostavax completed Yes   Shingrix Completed?: No.    Education has been provided regarding the importance of this vaccine. Patient has been advised to call insurance company to determine out of pocket expense if they have not yet received this vaccine. Advised may also receive vaccine at local pharmacy or Health Dept. Verbalized acceptance and understanding.  Screening Tests Health Maintenance  Topic Date Due  . FOOT EXAM  Never done  . OPHTHALMOLOGY EXAM  Never done  . DEXA SCAN  Never done  . HEMOGLOBIN A1C  08/30/2020  . INFLUENZA VACCINE  02/10/2021  . URINE MICROALBUMIN  02/27/2021  . TETANUS/TDAP  07/11/2022  . COVID-19 Vaccine  Completed  . PNA vac Low Risk Adult  Completed  . HPV VACCINES  Aged Out    Health Maintenance  Health Maintenance Due  Topic Date Due  . FOOT EXAM  Never done  . OPHTHALMOLOGY EXAM  Never done  . DEXA SCAN  Never done  . HEMOGLOBIN A1C  08/30/2020    Colorectal cancer screening: No longer required.   Mammogram status: Completed 06/24/2020. Repeat every year  Bone Density status: declined   Lung Cancer Screening: (Low Dose CT Chest recommended if Age 6-80 years, 30 pack-year currently smoking OR have quit w/in 15years.) does not qualify.   Additional Screening:  Hepatitis C Screening: does not qualify; Completed: N/A  Vision Screening: Recommended annual ophthalmology exams for early detection of glaucoma and other disorders of the eye. Is the patient up to date with their annual eye exam?  Yes  Who is the provider or what is the name of the office in which the patient attends annual eye exams? [facility] If pt is not established with a provider, would they like to be referred to a provider to  establish care? No .   Dental Screening: Recommended annual dental exams for proper oral hygiene  Community Resource Referral / Chronic Care Management: CRR required this visit?  No   CCM required this visit?  No      Plan:     I have personally reviewed and noted the following in the patient's chart:   . Medical and social history . Use of alcohol, tobacco or illicit drugs  . Current medications and supplements including opioid prescriptions.  . Functional ability and status . Nutritional status . Physical activity . Advanced directives . List of other physicians . Hospitalizations, surgeries, and ER visits in previous 12 months . Vitals . Screenings to include cognitive, depression, and falls . Referrals and appointments  In addition, I have reviewed and discussed with patient certain preventive protocols, quality metrics, and best practice recommendations. A written personalized care plan for preventive services as well as general preventive health recommendations were provided to patient.   Due to this being a telephonic visit, the after visit summary with patients personalized plan was offered to patient via office or my-chart. Patient preferred to pick up at office at next visit or via mychart.   Andrez Grime, LPN   07/15/7251

## 2020-12-04 NOTE — Telephone Encounter (Signed)
error 

## 2020-12-13 DIAGNOSIS — Z23 Encounter for immunization: Secondary | ICD-10-CM | POA: Diagnosis not present

## 2020-12-18 ENCOUNTER — Encounter: Payer: Self-pay | Admitting: Primary Care

## 2020-12-18 ENCOUNTER — Other Ambulatory Visit: Payer: Self-pay

## 2020-12-18 ENCOUNTER — Ambulatory Visit (INDEPENDENT_AMBULATORY_CARE_PROVIDER_SITE_OTHER): Payer: Medicare Other | Admitting: Primary Care

## 2020-12-18 VITALS — BP 126/80 | HR 82 | Temp 97.0°F | Ht <= 58 in | Wt 164.0 lb

## 2020-12-18 DIAGNOSIS — K219 Gastro-esophageal reflux disease without esophagitis: Secondary | ICD-10-CM | POA: Diagnosis not present

## 2020-12-18 DIAGNOSIS — F411 Generalized anxiety disorder: Secondary | ICD-10-CM | POA: Diagnosis not present

## 2020-12-18 DIAGNOSIS — M353 Polymyalgia rheumatica: Secondary | ICD-10-CM | POA: Diagnosis not present

## 2020-12-18 DIAGNOSIS — M8949 Other hypertrophic osteoarthropathy, multiple sites: Secondary | ICD-10-CM | POA: Diagnosis not present

## 2020-12-18 DIAGNOSIS — E119 Type 2 diabetes mellitus without complications: Secondary | ICD-10-CM

## 2020-12-18 DIAGNOSIS — I7090 Unspecified atherosclerosis: Secondary | ICD-10-CM | POA: Diagnosis not present

## 2020-12-18 DIAGNOSIS — E538 Deficiency of other specified B group vitamins: Secondary | ICD-10-CM | POA: Diagnosis not present

## 2020-12-18 DIAGNOSIS — I10 Essential (primary) hypertension: Secondary | ICD-10-CM | POA: Diagnosis not present

## 2020-12-18 DIAGNOSIS — E785 Hyperlipidemia, unspecified: Secondary | ICD-10-CM

## 2020-12-18 DIAGNOSIS — M159 Polyosteoarthritis, unspecified: Secondary | ICD-10-CM

## 2020-12-18 LAB — COMPREHENSIVE METABOLIC PANEL
ALT: 21 U/L (ref 0–35)
AST: 22 U/L (ref 0–37)
Albumin: 4.2 g/dL (ref 3.5–5.2)
Alkaline Phosphatase: 59 U/L (ref 39–117)
BUN: 15 mg/dL (ref 6–23)
CO2: 31 mEq/L (ref 19–32)
Calcium: 9.9 mg/dL (ref 8.4–10.5)
Chloride: 102 mEq/L (ref 96–112)
Creatinine, Ser: 0.9 mg/dL (ref 0.40–1.20)
GFR: 58 mL/min — ABNORMAL LOW (ref >60.00)
Glucose, Bld: 122 mg/dL — ABNORMAL HIGH (ref 70–99)
Potassium: 3.4 mEq/L — ABNORMAL LOW (ref 3.5–5.1)
Sodium: 142 mEq/L (ref 135–145)
Total Bilirubin: 0.6 mg/dL (ref 0.2–1.2)
Total Protein: 7.5 g/dL (ref 6.0–8.3)

## 2020-12-18 LAB — HEMOGLOBIN A1C: Hgb A1c MFr Bld: 6.8 % — ABNORMAL HIGH (ref 4.6–6.5)

## 2020-12-18 LAB — VITAMIN B12: Vitamin B-12: 666 pg/mL (ref 211–911)

## 2020-12-18 NOTE — Assessment & Plan Note (Signed)
Doing well on pravastatin 80 mg, last lipid panel reviewed from November 2021.

## 2020-12-18 NOTE — Assessment & Plan Note (Signed)
Not currently on medications.  Repeat A1C pending.

## 2020-12-18 NOTE — Assessment & Plan Note (Signed)
Compliant to pravastatin 80 mg, lipid panel from November 2021 at goal. Continue pravastatin 80 mg.

## 2020-12-18 NOTE — Assessment & Plan Note (Signed)
Chronic, no improvement with gabapentin, is doing better with Tylenol. Continue same. Encouraged regular walking and stretching.

## 2020-12-18 NOTE — Assessment & Plan Note (Signed)
Well controlled in the office today, continue HCTZ 12.5 mg daily. CMP pending.

## 2020-12-18 NOTE — Progress Notes (Signed)
Subjective:    Patient ID: Alyssa Roy, female    DOB: [birth year], 87 y.o.   MRN: [id number]  HPI  Alyssa Roy is a very pleasant 87 y.o. female with a history of hypertension, atherosclerosis, GERD, type 2 diabetes, osteoarthritis, GAD, hyperlipidemia, hyperglycemia who presents today for follow up of chronic conditions.  Overall she's doing well, has noticed some intermittent lightheadedness, thinks she may have "inner ear". She has used Flonase in the past with some improvement, did have "sinus" symptoms a few weeks back.   She's no longer taking oral B12 5000 mcg vitamins as her level was >1500 in November 2021.  She completed her mammogram in December 2021.   Is no longer taking gabapentin 100 mg as it was ineffective. She is following with orthopedics for arthritis and plans on getting another cortisone injection to the neck/shoulder.    BP Readings from Last 3 Encounters:  12/18/20 126/80  05/30/20 140/72  04/16/20 130/80       Review of Systems  Eyes: Negative for visual disturbance.  Respiratory: Negative for shortness of breath.   Cardiovascular: Negative for chest pain.  Musculoskeletal: Positive for arthralgias and neck pain.  Neurological: Positive for light-headedness.       See HPI  Psychiatric/Behavioral: The patient is not nervous/anxious.          Past Medical History:  Diagnosis Date  . Atherosclerosis   . Chronic venous stasis   . Diabetes mellitus without complication ([place])   . GAD (generalized anxiety disorder)   . Hyperglycemia   . Hyperlipidemia   . Hypertension   . Insomnia   . Osteoarthritis   . Urinary tract infection     Social History   Socioeconomic History  . Marital status: Unknown    Spouse name: Not on file  . Number of children: Not on file  . Years of education: Not on file  . Highest education level: Not on file  Occupational History  . Not on file  Tobacco Use  . Smoking status: Never Smoker  .  Smokeless tobacco: Never Used  Substance and Sexual Activity  . Alcohol use: Not Currently  . Drug use: Not on file  . Sexual activity: Not on file  Other Topics Concern  . Not on file  Social History Narrative   Widow.   Retired.   Moved to [place] to live closer to daughter.   Social Determinants of Health   Financial Resource Strain: Low Risk   . Difficulty of Paying Living Expenses: Not hard at all  Food Insecurity: No Food Insecurity  . Worried About Charity fundraiser in the Last Year: Never true  . Ran Out of Food in the Last Year: Never true  Transportation Needs: No Transportation Needs  . Lack of Transportation (Medical): No  . Lack of Transportation (Non-Medical): No  Physical Activity: Inactive  . Days of Exercise per Week: 0 days  . Minutes of Exercise per Session: 0 min  Stress: No Stress Concern Present  . Feeling of Stress : Not at all  Social Connections: Not on file  Intimate Partner Violence: Not At Risk  . Fear of Current or Ex-Partner: No  . Emotionally Abused: No  . Physically Abused: No  . Sexually Abused: No    Past Surgical History:  Procedure Laterality Date  . ABDOMINAL HYSTERECTOMY  1979  . BREAST BIOPSY Right    neg early 2000's  . CHOLECYSTECTOMY  1984  . REPLACEMENT TOTAL KNEE Right  2007    Family History  Problem Relation Age of Onset  . Heart disease Mother   . Arthritis Mother   . Diabetes Father   . Breast cancer Daughter 28    Allergies  Allergen Reactions  . Crestor [Rosuvastatin Calcium]   . Feldene [Piroxicam]   . Keflex [Cephalexin]   . Lipitor [Atorvastatin Calcium]   . Zetia [Ezetimibe]   . Aspirin Other (See Comments)    Stomach discomfort    Current Outpatient Medications on File Prior to Visit  Medication Sig Dispense Refill  . acetaminophen (TYLENOL) 650 MG CR tablet Take 650 mg by mouth every 8 (eight) hours as needed for pain.    . Cholecalciferol (D-3-5) 125 MCG (5000 UT) capsule Take 5,000 Units by  mouth daily.    Marland Kitchen co-enzyme Q-10 30 MG capsule Take 30 mg by mouth daily.    . Coenzyme Q10 (COQ10) 100 MG CAPS Take by mouth daily.     . diclofenac Sodium (VOLTAREN) 1 % GEL Apply 2 g topically 3 (three) times daily as needed. 100 g 1  . famotidine (PEPCID) 20 MG tablet TAKE 1 TABLET (20 MG TOTAL) BY MOUTH DAILY. FOR HEARTBURN. 90 tablet 1  . hydrochlorothiazide (HYDRODIURIL) 12.5 MG tablet TAKE 1 TABLET (12.5 MG TOTAL) BY MOUTH DAILY. FOR BLOOD PRESSURE AND SWELLING. 90 tablet 1  . pravastatin (PRAVACHOL) 80 MG tablet Take 1 tablet (80 mg total) by mouth every evening. For cholesterol. 90 tablet 3  . Cyanocobalamin (B-12) 5000 MCG CAPS Take by mouth daily. Twice a week (Patient not taking: Reported on 12/18/2020)    . gabapentin (NEURONTIN) 100 MG capsule Take by mouth. (Patient not taking: Reported on 12/18/2020)     No current facility-administered medications on file prior to visit.    BP 126/80   Pulse 82   Temp (!) 97 F (36.1 C) (Temporal)   Ht 4\' 7"  (1.397 m)   Wt 164 lb (74.4 kg)   SpO2 96%   BMI 38.12 kg/m  Objective:   Physical Exam Cardiovascular:     Rate and Rhythm: Normal rate and regular rhythm.  Pulmonary:     Effort: Pulmonary effort is normal.     Breath sounds: Normal breath sounds.  Musculoskeletal:     Cervical back: Neck supple.  Skin:    General: Skin is warm and dry.  Psychiatric:        Mood and Affect: Mood normal.           Assessment & Plan:      This visit occurred during the SARS-CoV-2 public health emergency.  Safety protocols were in place, including screening questions prior to the visit, additional usage of staff PPE, and extensive cleaning of exam room while observing appropriate contact time as indicated for disinfecting solutions.

## 2020-12-18 NOTE — Assessment & Plan Note (Signed)
Overall stable, denies concerns today. Continue to monitor.

## 2020-12-18 NOTE — Patient Instructions (Signed)
Stop by the lab prior to leaving today. I will notify you of your results once received.   Resume Flonase nasal spray for inner ear/fluid. Use 1 spray in each nostril twice daily.  It was a pleasure to see you today!

## 2020-12-18 NOTE — Assessment & Plan Note (Signed)
No longer on gabapentin, follows with rheumatology.

## 2020-12-18 NOTE — Assessment & Plan Note (Signed)
Doing well on PRN famotidine 20 mg, continue same.

## 2020-12-18 NOTE — Assessment & Plan Note (Signed)
No longer on oral B12, repeat B12 level pending.

## 2020-12-19 ENCOUNTER — Other Ambulatory Visit: Payer: Self-pay

## 2020-12-19 DIAGNOSIS — E119 Type 2 diabetes mellitus without complications: Secondary | ICD-10-CM

## 2021-01-28 DIAGNOSIS — M79675 Pain in left toe(s): Secondary | ICD-10-CM | POA: Diagnosis not present

## 2021-01-28 DIAGNOSIS — M353 Polymyalgia rheumatica: Secondary | ICD-10-CM | POA: Diagnosis not present

## 2021-01-28 DIAGNOSIS — M159 Polyosteoarthritis, unspecified: Secondary | ICD-10-CM | POA: Diagnosis not present

## 2021-01-28 DIAGNOSIS — B351 Tinea unguium: Secondary | ICD-10-CM | POA: Diagnosis not present

## 2021-01-28 DIAGNOSIS — M79674 Pain in right toe(s): Secondary | ICD-10-CM | POA: Diagnosis not present

## 2021-01-28 DIAGNOSIS — M1712 Unilateral primary osteoarthritis, left knee: Secondary | ICD-10-CM | POA: Diagnosis not present

## 2021-03-04 ENCOUNTER — Other Ambulatory Visit: Payer: Self-pay | Admitting: Primary Care

## 2021-03-04 DIAGNOSIS — K219 Gastro-esophageal reflux disease without esophagitis: Secondary | ICD-10-CM

## 2021-03-26 DIAGNOSIS — H2513 Age-related nuclear cataract, bilateral: Secondary | ICD-10-CM | POA: Diagnosis not present

## 2021-03-26 LAB — HM DIABETES EYE EXAM

## 2021-04-02 ENCOUNTER — Encounter: Payer: Self-pay | Admitting: Primary Care

## 2021-04-16 ENCOUNTER — Other Ambulatory Visit: Payer: Self-pay | Admitting: Primary Care

## 2021-04-16 DIAGNOSIS — E785 Hyperlipidemia, unspecified: Secondary | ICD-10-CM

## 2021-05-14 ENCOUNTER — Other Ambulatory Visit: Payer: Self-pay | Admitting: Primary Care

## 2021-05-14 DIAGNOSIS — I10 Essential (primary) hypertension: Secondary | ICD-10-CM

## 2021-05-14 DIAGNOSIS — R600 Localized edema: Secondary | ICD-10-CM

## 2021-05-27 ENCOUNTER — Other Ambulatory Visit: Payer: Self-pay | Admitting: Primary Care

## 2021-05-27 DIAGNOSIS — Z1231 Encounter for screening mammogram for malignant neoplasm of breast: Secondary | ICD-10-CM

## 2021-06-02 DIAGNOSIS — M353 Polymyalgia rheumatica: Secondary | ICD-10-CM | POA: Diagnosis not present

## 2021-06-02 DIAGNOSIS — M1712 Unilateral primary osteoarthritis, left knee: Secondary | ICD-10-CM | POA: Diagnosis not present

## 2021-06-02 DIAGNOSIS — M159 Polyosteoarthritis, unspecified: Secondary | ICD-10-CM | POA: Diagnosis not present

## 2021-06-02 DIAGNOSIS — M65342 Trigger finger, left ring finger: Secondary | ICD-10-CM | POA: Diagnosis not present

## 2021-06-13 DIAGNOSIS — Z23 Encounter for immunization: Secondary | ICD-10-CM | POA: Diagnosis not present

## 2021-06-23 ENCOUNTER — Other Ambulatory Visit: Payer: Medicare Other

## 2021-06-25 ENCOUNTER — Other Ambulatory Visit: Payer: Self-pay

## 2021-06-25 ENCOUNTER — Ambulatory Visit
Admission: RE | Admit: 2021-06-25 | Discharge: 2021-06-25 | Disposition: A | Discharge status: Home / Self Care | Payer: Medicare Other | Source: Ambulatory Visit | Attending: Primary Care | Admitting: Primary Care

## 2021-06-25 DIAGNOSIS — Z1231 Encounter for screening mammogram for malignant neoplasm of breast: Secondary | ICD-10-CM

## 2021-06-26 ENCOUNTER — Other Ambulatory Visit: Payer: Self-pay

## 2021-06-26 ENCOUNTER — Other Ambulatory Visit (INDEPENDENT_AMBULATORY_CARE_PROVIDER_SITE_OTHER): Payer: Medicare Other

## 2021-06-26 ENCOUNTER — Telehealth: Payer: Self-pay | Admitting: Primary Care

## 2021-06-26 DIAGNOSIS — E785 Hyperlipidemia, unspecified: Secondary | ICD-10-CM | POA: Diagnosis not present

## 2021-06-26 DIAGNOSIS — E119 Type 2 diabetes mellitus without complications: Secondary | ICD-10-CM | POA: Diagnosis not present

## 2021-06-26 NOTE — Telephone Encounter (Signed)
error 

## 2021-06-27 ENCOUNTER — Telehealth: Payer: Self-pay | Admitting: Primary Care

## 2021-06-27 DIAGNOSIS — E785 Hyperlipidemia, unspecified: Secondary | ICD-10-CM

## 2021-06-27 LAB — HEMOGLOBIN A1C: Hgb A1c MFr Bld: 6.3 % (ref 4.6–6.5)

## 2021-06-27 NOTE — Telephone Encounter (Signed)
Teams sent to [facility] to let her know order is placed she will send sample today.

## 2021-06-27 NOTE — Telephone Encounter (Signed)
Okay to have lipid panel checked.  Orders placed.

## 2021-06-27 NOTE — Telephone Encounter (Signed)
Pt called in stating she had blood work done at [facility] yesterday for her blood for sugar. Pt also wanted test done for cholesterol and so she had that done also, but was told to call the doctor and have them ok the blood work that was done for cholesterol. The technician would hold the blood until late this afternoon which would be 12/16, if the doctor said it was ok then she would send the blood in.

## 2021-06-28 LAB — LIPID PANEL
Chol/HDL Ratio: 2.9 ratio (ref 0.0–4.4)
Cholesterol, Total: 230 mg/dL — ABNORMAL HIGH (ref 100–199)
HDL: 79 mg/dL (ref >39)
LDL Chol Calc (NIH): 134 mg/dL — ABNORMAL HIGH (ref 0–99)
Triglycerides: 98 mg/dL (ref 0–149)
VLDL Cholesterol Cal: 17 mg/dL (ref 5–40)

## 2021-07-01 NOTE — Telephone Encounter (Signed)
Added to lab note.

## 2021-07-01 NOTE — Telephone Encounter (Signed)
Patient states she is awaiting results of her cholesterol lab, states she has not taken  her  pravastatin (PRAVACHOL) 80 MG tablet in the last month because it causes her itch

## 2021-07-03 ENCOUNTER — Other Ambulatory Visit: Payer: Self-pay | Admitting: Primary Care

## 2021-07-03 DIAGNOSIS — E785 Hyperlipidemia, unspecified: Secondary | ICD-10-CM

## 2021-07-13 ENCOUNTER — Other Ambulatory Visit: Payer: Self-pay | Admitting: Primary Care

## 2021-07-13 DIAGNOSIS — E785 Hyperlipidemia, unspecified: Secondary | ICD-10-CM

## 2021-08-01 ENCOUNTER — Other Ambulatory Visit: Payer: Self-pay | Admitting: Primary Care

## 2021-08-01 DIAGNOSIS — E785 Hyperlipidemia, unspecified: Secondary | ICD-10-CM

## 2021-08-13 ENCOUNTER — Other Ambulatory Visit: Payer: Self-pay | Admitting: Primary Care

## 2021-08-13 DIAGNOSIS — R600 Localized edema: Secondary | ICD-10-CM

## 2021-08-13 DIAGNOSIS — I10 Essential (primary) hypertension: Secondary | ICD-10-CM

## 2021-09-06 ENCOUNTER — Other Ambulatory Visit: Payer: Self-pay | Admitting: Primary Care

## 2021-09-06 DIAGNOSIS — K219 Gastro-esophageal reflux disease without esophagitis: Secondary | ICD-10-CM

## 2021-09-30 DIAGNOSIS — M159 Polyosteoarthritis, unspecified: Secondary | ICD-10-CM | POA: Diagnosis not present

## 2021-09-30 DIAGNOSIS — M791 Myalgia, unspecified site: Secondary | ICD-10-CM | POA: Diagnosis not present

## 2021-09-30 DIAGNOSIS — M353 Polymyalgia rheumatica: Secondary | ICD-10-CM | POA: Diagnosis not present

## 2021-10-28 DIAGNOSIS — M1712 Unilateral primary osteoarthritis, left knee: Secondary | ICD-10-CM | POA: Diagnosis not present

## 2021-11-12 ENCOUNTER — Other Ambulatory Visit: Payer: Self-pay | Admitting: Primary Care

## 2021-11-12 DIAGNOSIS — K219 Gastro-esophageal reflux disease without esophagitis: Secondary | ICD-10-CM

## 2021-12-05 ENCOUNTER — Ambulatory Visit: Payer: Medicare Other

## 2021-12-05 ENCOUNTER — Ambulatory Visit (INDEPENDENT_AMBULATORY_CARE_PROVIDER_SITE_OTHER): Payer: Medicare Other

## 2021-12-05 VITALS — Ht <= 58 in | Wt 149.0 lb

## 2021-12-05 DIAGNOSIS — Z0000 Encounter for general adult medical examination without abnormal findings: Secondary | ICD-10-CM | POA: Diagnosis not present

## 2021-12-05 NOTE — Progress Notes (Signed)
I connected with Natalie Mceuen today by telephone and verified that I am speaking with the correct person using two identifiers. Location patient: home Location provider: work Persons participating in the virtual visit: Kalley, Nicholl LPN.   I discussed the limitations, risks, security and privacy concerns of performing an evaluation and management service by telephone and the availability of in person appointments. I also discussed with the patient that there may be a patient responsible charge related to this service. The patient expressed understanding and verbally consented to this telephonic visit.    Interactive audio and video telecommunications were attempted between this provider and patient, however failed, due to patient having technical difficulties OR patient did not have access to video capability.  We continued and completed visit with audio only.     Vital signs may be patient reported or missing.  Subjective:   Scarlett Portlock is a 88 y.o. female who presents for Medicare Annual (Subsequent) preventive examination.  Review of Systems     Cardiac Risk Factors include: advanced age (>52mn, >>40women);diabetes mellitus;hypertension;female gender;obesity (BMI >30kg/m2)     Objective:    Today's Vitals   12/05/21 1326 12/05/21 1327  Weight: 149 lb (67.6 kg)   Height: '4\' 7"'$  (1.397 m)   PainSc:  9    Body mass index is 34.63 kg/m.     12/05/2021    1:35 PM 12/04/2020    3:35 PM 11/02/2019    9:05 AM 04/23/2018   12:13 PM  Advanced Directives  Does Patient Have a Medical Advance Directive? Yes  Yes Yes  Type of AParamedicof AFive [place]Living will [place] will [place] will [place]Living will  Does patient want to make changes to medical advance directive?    No - Patient declined  Copy of HB[place]in Chart? No - copy  requested No - copy requested No - copy requested No - copy requested    Current Medications (verified) Outpatient Encounter Medications as of 12/05/2021  Medication Sig   acetaminophen (TYLENOL) 650 MG CR tablet Take 650 mg by mouth every 8 (eight) hours as needed for pain.   Coenzyme Q10 (COQ10) 100 MG CAPS Take by mouth daily.    diclofenac Sodium (VOLTAREN) 1 % GEL Apply 2 g topically 3 (three) times daily as needed.   famotidine (PEPCID) 20 MG tablet Take 1 tablet (20 mg total) by mouth daily. For heartburn. Office visit required for further refills.   hydrochlorothiazide (HYDRODIURIL) 12.5 MG tablet TAKE 1 TABLET (12.5 MG TOTAL) BY MOUTH DAILY. FOR BLOOD PRESSURE AND SWELLING.   Cholecalciferol (D-3-5) 125 MCG (5000 UT) capsule Take 5,000 Units by mouth daily. (Patient not taking: Reported on 12/05/2021)   co-enzyme Q-10 30 MG capsule Take 30 mg by mouth daily.   pravastatin (PRAVACHOL) 40 MG tablet Take 40 mg by mouth daily. (Patient not taking: Reported on 12/05/2021)   No facility-administered encounter medications on file as of 12/05/2021.    Allergies (verified) Crestor [rosuvastatin calcium], Feldene [piroxicam], Keflex [cephalexin], Lipitor [atorvastatin calcium], Zetia [ezetimibe], and Aspirin   History: Past Medical History:  Diagnosis Date   Atherosclerosis    Chronic venous stasis    Diabetes mellitus without complication (HCC)    GAD (generalized anxiety disorder)    Hyperglycemia    Hyperlipidemia    Hypertension    Insomnia    Osteoarthritis    Urinary tract infection    Past Surgical  History:  Procedure Laterality Date   ABDOMINAL HYSTERECTOMY  1979   BREAST BIOPSY Right    neg early 2000's   CHOLECYSTECTOMY  1984   REPLACEMENT TOTAL KNEE Right 2007   Family History  Problem Relation Age of Onset   Heart disease Mother    Arthritis Mother    Diabetes Father    Breast cancer Daughter 76   Social History   Socioeconomic History   Marital status:  Unknown    Spouse name: Not on file   Number of children: Not on file   Years of education: Not on file   Highest education level: Not on file  Occupational History   Not on file  Tobacco Use   Smoking status: Never   Smokeless tobacco: Never  Vaping Use   Vaping Use: Never used  Substance and Sexual Activity   Alcohol use: Not Currently   Drug use: Not Currently   Sexual activity: Not Currently  Other Topics Concern   Not on file  Social History Narrative   Widow.   Retired.   Moved to [place] to live closer to daughter.   Social Determinants of Health   Financial Resource Strain: Low Risk    Difficulty of Paying Living Expenses: Not hard at all  Food Insecurity: No Food Insecurity   Worried About Charity fundraiser in the Last Year: Never true   [place] in the Last Year: Never true  Transportation Needs: No Transportation Needs   Lack of Transportation (Medical): No   Lack of Transportation (Non-Medical): No  Physical Activity: Inactive   Days of Exercise per Week: 0 days   Minutes of Exercise per Session: 0 min  Stress: No Stress Concern Present   Feeling of Stress : Not at all  Social Connections: Not on file    Tobacco Counseling Counseling given: Not Answered   Clinical Intake:  Pre-visit preparation completed: Yes  Pain : 0-10 Pain Score: 9  Pain Type: Chronic pain Pain Location: Generalized Pain Descriptors / Indicators: Aching Pain Onset: More than a month ago Pain Frequency: Constant     Nutritional Status: BMI > 30  Obese Nutritional Risks: None Diabetes: Yes Nutrition Risk Assessment:  Has the patient had any N/V/D within the last 2 months?  No  Does the patient have any non-healing wounds?  No  Has the patient had any unintentional weight loss or weight gain?  No   Diabetes:  Is the patient diabetic?  Yes  If diabetic, was a CBG obtained today?  No  Did the patient bring in their glucometer from home?  No  How often do  you monitor your CBG's? Does not.   Financial Strains and Diabetes Management:  Are you having any financial strains with the device, your supplies or your medication? No .  Does the patient want to be seen by Chronic Care Management for management of their diabetes?  No  Would the patient like to be referred to a Nutritionist or for Diabetic Management?  No   Diabetic Exams:  Diabetic Eye Exam: Completed 03/26/2021 Diabetic Foot Exam: Overdue, Pt has been advised about the importance in completing this exam. Pt is scheduled for diabetic foot exam on next appointment.   How often do you need to have someone help you when you read instructions, pamphlets, or other written materials from your doctor or pharmacy?: 1 - Never What is the last grade level you completed in school?: 12th grade  Diabetic?  no  Interpreter Needed?: No  Information entered by :: NAllen LPN   Activities of Daily Living    12/05/2021    1:37 PM  In your present state of health, do you have any difficulty performing the following activities:  Hearing? 1  Comment hearings aid  Vision? 0  Difficulty concentrating or making decisions? 0  Walking or climbing stairs? 1  Dressing or bathing? 0  Doing errands, shopping? 1  Comment does not drive  Preparing Food and eating ? N  Using the Toilet? N  In the past six months, have you accidently leaked urine? N  Do you have problems with loss of bowel control? N  Managing your Medications? N  Managing your Finances? N  Housekeeping or managing your Housekeeping? N    Patient Care Team: Pleas Koch, NP as PCP - General (Internal Medicine)  Indicate any recent Medical Services you may have received from other than [facility] providers in the past year (date may be approximate).     Assessment:   This is a routine wellness examination for [place].  Hearing/Vision screen Vision Screening - Comments:: Regular eye exams, [facility]  Dietary issues  and exercise activities discussed: Current Exercise Habits: The patient does not participate in regular exercise at present   Goals Addressed             This Visit's Progress    Patient Stated       12/05/2021, no goals       Depression Screen    12/05/2021    1:37 PM 12/04/2020    3:38 PM 11/02/2019    9:06 AM  PHQ 2/9 Scores  PHQ - 2 Score 0 0 0  PHQ- 9 Score  0 0    Fall Risk    12/05/2021    1:37 PM 12/04/2020    3:38 PM 11/02/2019    9:05 AM  [place] in the past year? 0 1 0  Number falls in past yr: 0 0 0  Injury with Fall? 0 0 0  Risk for fall due to : Medication side effect Medication side effect;Impaired balance/gait Medication side effect  Follow up Falls evaluation completed;Education provided;Falls prevention discussed Falls evaluation completed;Falls prevention discussed Falls evaluation completed;Falls prevention discussed    FALL RISK PREVENTION PERTAINING TO THE HOME:  Any stairs in or around the home? Yes  If so, are there any without handrails? No  Home free of loose throw rugs in walkways, pet beds, electrical cords, etc? Yes  Adequate lighting in your home to reduce risk of falls? Yes   ASSISTIVE DEVICES UTILIZED TO PREVENT FALLS:  Life alert? No  Use of a cane, walker or w/c? Yes  Grab bars in the bathroom? No  Shower chair or bench in shower? Yes  Elevated toilet seat or a handicapped toilet? No   TIMED UP AND GO:  Was the test performed? No .      Cognitive Function:    12/04/2020    3:42 PM 11/02/2019    9:09 AM  MMSE - Mini Mental State Exam  Orientation to time 5 5  Orientation to Place 5 5  Registration 3 3  Attention/ Calculation 5 5  Recall 3 3  Language- repeat 1 1        12/05/2021    1:40 PM  6CIT Screen  What Year? 0 points  What month? 0 points  What time? 0 points  Count back from  20 0 points  Months in reverse 4 points  Repeat phrase 6 points  Total Score 10 points     Immunizations Immunization History  Administered Date(s) Administered   Influenza, High Dose Seasonal PF 04/12/2018, 04/12/2019   Influenza-Unspecified 04/01/2017, 04/16/2020   PFIZER Comirnaty(Gray Top)Covid-19 Tri-Sucrose Vaccine 12/13/2020   PFIZER(Purple Top)SARS-COV-2 Vaccination 09/04/2019, 09/25/2019, 04/15/2020   Pneumococcal Conjugate-13 09/12/2015   Pneumococcal Polysaccharide-23 07/14/1999, 09/28/2017   Tdap 07/11/2012   Zoster Recombinat (Shingrix) 04/12/2018   Zoster, Live 07/22/2009    TDAP status: Up to date  Flu Vaccine status: Up to date  Pneumococcal vaccine status: Up to date  Covid-19 vaccine status: Completed vaccines  Qualifies for Shingles Vaccine? Yes   Zostavax completed Yes   Shingrix Completed?: needs second dose  Screening Tests Health Maintenance  Topic Date Due   FOOT EXAM  Never done   DEXA SCAN  Never done   Zoster Vaccines- Shingrix (2 of 2) 06/07/2018   URINE MICROALBUMIN  02/27/2021   HEMOGLOBIN A1C  12/25/2021   INFLUENZA VACCINE  02/10/2022   OPHTHALMOLOGY EXAM  03/26/2022   TETANUS/TDAP  07/11/2022   Pneumonia Vaccine 76+ Years old  Completed   COVID-19 Vaccine  Completed   HPV VACCINES  Aged Out    Health Maintenance  Health Maintenance Due  Topic Date Due   FOOT EXAM  Never done   DEXA SCAN  Never done   Zoster Vaccines- Shingrix (2 of 2) 06/07/2018   URINE MICROALBUMIN  02/27/2021    Colorectal cancer screening: No longer required.   Mammogram status: Completed 06/25/2021. Repeat every year  Bone Density status: decline  Lung Cancer Screening: (Low Dose CT Chest recommended if Age 72-80 years, 30 pack-year currently smoking OR have quit w/in 15years.) does not qualify.   Lung Cancer Screening Referral: no  Additional Screening:  Hepatitis C Screening: does not qualify;  Vision Screening: Recommended annual ophthalmology exams for early detection of glaucoma and other disorders of the eye. Is the patient  up to date with their annual eye exam?  Yes  Who is the provider or what is the name of the office in which the patient attends annual eye exams? [facility] If pt is not established with a provider, would they like to be referred to a provider to establish care? No .   Dental Screening: Recommended annual dental exams for proper oral hygiene  Community Resource Referral / Chronic Care Management: CRR required this visit?  No   CCM required this visit?  No      Plan:     I have personally reviewed and noted the following in the patient's chart:   Medical and social history Use of alcohol, tobacco or illicit drugs  Current medications and supplements including opioid prescriptions.  Functional ability and status Nutritional status Physical activity Advanced directives List of other physicians Hospitalizations, surgeries, and ER visits in previous 12 months Vitals Screenings to include cognitive, depression, and falls Referrals and appointments  In addition, I have reviewed and discussed with patient certain preventive protocols, quality metrics, and best practice recommendations. A written personalized care plan for preventive services as well as general preventive health recommendations were provided to patient.     Kellie Simmering, LPN   [id number]   Nurse Notes: none  Due to this being a virtual visit, the after visit summary with patients personalized plan was offered to patient via mail or my-chart.  Patient preferred to pick up at office at next  visit

## 2021-12-05 NOTE — Patient Instructions (Signed)
Ms. Alyssa Roy , Thank you for taking time to come for your Medicare Wellness Visit. I appreciate your ongoing commitment to your health goals. Please review the following plan we discussed and let me know if I can assist you in the future.   Screening recommendations/referrals: Colonoscopy: not required Mammogram: completed 06/25/2021, due 06/26/2022 Bone Density: decline Recommended yearly ophthalmology/optometry visit for glaucoma screening and checkup Recommended yearly dental visit for hygiene and checkup  Vaccinations: Influenza vaccine: due 02/10/2022 Pneumococcal vaccine: completed 09/28/2017 Tdap vaccine: completed 07/11/2012, due 07/11/2022 Shingles vaccine: needs second dose   Covid-19: 06/13/2021, 12/13/2020, 04/15/2020, 09/25/2019, 09/04/2019  Advanced directives: Please bring a copy of your POA (Power of Attorney) and/or Living Will to your next appointment.   Conditions/risks identified: none  Next appointment: Follow up in one year for your annual wellness visit    Preventive Care 65 Years and Older, Female Preventive care refers to lifestyle choices and visits with your health care provider that can promote health and wellness. What does preventive care include? A yearly physical exam. This is also called an annual well check. Dental exams once or twice a year. Routine eye exams. Ask your health care provider how often you should have your eyes checked. Personal lifestyle choices, including: Daily care of your teeth and gums. Regular physical activity. Eating a healthy diet. Avoiding tobacco and drug use. Limiting alcohol use. Practicing safe sex. Taking low-dose aspirin every day. Taking vitamin and mineral supplements as recommended by your health care provider. What happens during an annual well check? The services and screenings done by your health care provider during your annual well check will depend on your age, overall health, lifestyle risk factors, and family  history of disease. Counseling  Your health care provider may ask you questions about your: Alcohol use. Tobacco use. Drug use. Emotional well-being. Home and relationship well-being. Sexual activity. Eating habits. History of falls. Memory and ability to understand (cognition). Work and work Statistician. Reproductive health. Screening  You may have the following tests or measurements: Height, weight, and BMI. Blood pressure. Lipid and cholesterol levels. These may be checked every 5 years, or more frequently if you are over 55 years old. Skin check. Lung cancer screening. You may have this screening every year starting at age 11 if you have a 30-pack-year history of smoking and currently smoke or have quit within the past 15 years. Fecal occult blood test (FOBT) of the stool. You may have this test every year starting at age 38. Flexible sigmoidoscopy or colonoscopy. You may have a sigmoidoscopy every 5 years or a colonoscopy every 10 years starting at age 86. Hepatitis C blood test. Hepatitis B blood test. Sexually transmitted disease (STD) testing. Diabetes screening. This is done by checking your blood sugar (glucose) after you have not eaten for a while (fasting). You may have this done every 1-3 years. Bone density scan. This is done to screen for osteoporosis. You may have this done starting at age 2. Mammogram. This may be done every 1-2 years. Talk to your health care provider about how often you should have regular mammograms. Talk with your health care provider about your test results, treatment options, and if necessary, the need for more tests. Vaccines  Your health care provider may recommend certain vaccines, such as: Influenza vaccine. This is recommended every year. Tetanus, diphtheria, and acellular pertussis (Tdap, Td) vaccine. You may need a Td booster every 10 years. Zoster vaccine. You may need this after age 52. Pneumococcal 13-valent  conjugate (PCV13)  vaccine. One dose is recommended after age 67. Pneumococcal polysaccharide (PPSV23) vaccine. One dose is recommended after age 3. Talk to your health care provider about which screenings and vaccines you need and how often you need them. This information is not intended to replace advice given to you by your health care provider. Make sure you discuss any questions you have with your health care provider. Document Released: 07/26/2015 Document Revised: 03/18/2016 Document Reviewed: 04/30/2015 Elsevier Interactive Patient Education  2017 [place] Prevention in the Home Falls can cause injuries. They can happen to people of all ages. There are many things you can do to make your home safe and to help prevent falls. What can I do on the outside of my home? Regularly fix the edges of walkways and driveways and fix any cracks. Remove anything that might make you trip as you walk through a door, such as a raised step or threshold. Trim any bushes or trees on the path to your home. Use bright outdoor lighting. Clear any walking paths of anything that might make someone trip, such as rocks or tools. Regularly check to see if handrails are loose or broken. Make sure that both sides of any steps have handrails. Any raised decks and porches should have guardrails on the edges. Have any leaves, snow, or ice cleared regularly. Use sand or salt on walking paths during winter. Clean up any spills in your garage right away. This includes oil or grease spills. What can I do in the bathroom? Use night lights. Install grab bars by the toilet and in the tub and shower. Do not use towel bars as grab bars. Use non-skid mats or decals in the tub or shower. If you need to sit down in the shower, use a plastic, non-slip stool. Keep the floor dry. Clean up any water that spills on the floor as soon as it happens. Remove soap buildup in the tub or shower regularly. Attach bath mats securely with  double-sided non-slip rug tape. Do not have throw rugs and other things on the floor that can make you trip. What can I do in the bedroom? Use night lights. Make sure that you have a light by your bed that is easy to reach. Do not use any sheets or blankets that are too big for your bed. They should not hang down onto the floor. Have a firm chair that has side arms. You can use this for support while you get dressed. Do not have throw rugs and other things on the floor that can make you trip. What can I do in the kitchen? Clean up any spills right away. Avoid walking on wet floors. Keep items that you use a lot in easy-to-reach places. If you need to reach something above you, use a strong step stool that has a grab bar. Keep electrical cords out of the way. Do not use floor polish or wax that makes floors slippery. If you must use wax, use non-skid floor wax. Do not have throw rugs and other things on the floor that can make you trip. What can I do with my stairs? Do not leave any items on the stairs. Make sure that there are handrails on both sides of the stairs and use them. Fix handrails that are broken or loose. Make sure that handrails are as long as the stairways. Check any carpeting to make sure that it is firmly attached to the stairs. Fix any carpet that  is loose or worn. Avoid having throw rugs at the top or bottom of the stairs. If you do have throw rugs, attach them to the floor with carpet tape. Make sure that you have a light switch at the top of the stairs and the bottom of the stairs. If you do not have them, ask someone to add them for you. What else can I do to help prevent falls? Wear shoes that: Do not have high heels. Have rubber bottoms. Are comfortable and fit you well. Are closed at the toe. Do not wear sandals. If you use a stepladder: Make sure that it is fully opened. Do not climb a closed stepladder. Make sure that both sides of the stepladder are locked  into place. Ask someone to hold it for you, if possible. Clearly mark and make sure that you can see: Any grab bars or handrails. First and last steps. Where the edge of each step is. Use tools that help you move around (mobility aids) if they are needed. These include: Canes. Walkers. Scooters. Crutches. Turn on the lights when you go into a dark area. Replace any light bulbs as soon as they burn out. Set up your furniture so you have a clear path. Avoid moving your furniture around. If any of your floors are uneven, fix them. If there are any pets around you, be aware of where they are. Review your medicines with your doctor. Some medicines can make you feel dizzy. This can increase your chance of falling. Ask your doctor what other things that you can do to help prevent falls. This information is not intended to replace advice given to you by your health care provider. Make sure you discuss any questions you have with your health care provider. Document Released: 04/25/2009 Document Revised: 12/05/2015 Document Reviewed: 08/03/2014 Elsevier Interactive Patient Education  2017 [employer].

## 2021-12-23 ENCOUNTER — Encounter: Payer: Medicare Other | Admitting: Primary Care

## 2022-01-06 ENCOUNTER — Ambulatory Visit (INDEPENDENT_AMBULATORY_CARE_PROVIDER_SITE_OTHER): Payer: Medicare Other | Admitting: Primary Care

## 2022-01-06 ENCOUNTER — Encounter: Payer: Self-pay | Admitting: Primary Care

## 2022-01-06 VITALS — BP 134/78 | HR 82 | Temp 98.6°F | Ht <= 58 in | Wt 158.0 lb

## 2022-01-06 DIAGNOSIS — K219 Gastro-esophageal reflux disease without esophagitis: Secondary | ICD-10-CM

## 2022-01-06 DIAGNOSIS — E119 Type 2 diabetes mellitus without complications: Secondary | ICD-10-CM

## 2022-01-06 DIAGNOSIS — M159 Polyosteoarthritis, unspecified: Secondary | ICD-10-CM

## 2022-01-06 DIAGNOSIS — F411 Generalized anxiety disorder: Secondary | ICD-10-CM

## 2022-01-06 DIAGNOSIS — E538 Deficiency of other specified B group vitamins: Secondary | ICD-10-CM | POA: Diagnosis not present

## 2022-01-06 DIAGNOSIS — N6459 Other signs and symptoms in breast: Secondary | ICD-10-CM | POA: Diagnosis not present

## 2022-01-06 DIAGNOSIS — M8589 Other specified disorders of bone density and structure, multiple sites: Secondary | ICD-10-CM

## 2022-01-06 DIAGNOSIS — R109 Unspecified abdominal pain: Secondary | ICD-10-CM

## 2022-01-06 DIAGNOSIS — M25571 Pain in right ankle and joints of right foot: Secondary | ICD-10-CM

## 2022-01-06 DIAGNOSIS — E785 Hyperlipidemia, unspecified: Secondary | ICD-10-CM

## 2022-01-06 DIAGNOSIS — M353 Polymyalgia rheumatica: Secondary | ICD-10-CM | POA: Diagnosis not present

## 2022-01-06 DIAGNOSIS — I10 Essential (primary) hypertension: Secondary | ICD-10-CM | POA: Diagnosis not present

## 2022-01-06 DIAGNOSIS — M25579 Pain in unspecified ankle and joints of unspecified foot: Secondary | ICD-10-CM | POA: Insufficient documentation

## 2022-01-06 LAB — COMPREHENSIVE METABOLIC PANEL
ALT: 28 U/L (ref 0–35)
AST: 29 U/L (ref 0–37)
Albumin: 4 g/dL (ref 3.5–5.2)
Alkaline Phosphatase: 68 U/L (ref 39–117)
BUN: 21 mg/dL (ref 6–23)
CO2: 29 mEq/L (ref 19–32)
Calcium: 9.6 mg/dL (ref 8.4–10.5)
Chloride: 99 mEq/L (ref 96–112)
Creatinine, Ser: 0.83 mg/dL (ref 0.40–1.20)
GFR: 63.45 mL/min (ref >60.00)
Glucose, Bld: 98 mg/dL (ref 70–99)
Potassium: 3.5 mEq/L (ref 3.5–5.1)
Sodium: 141 mEq/L (ref 135–145)
Total Bilirubin: 1 mg/dL (ref 0.2–1.2)
Total Protein: 6.9 g/dL (ref 6.0–8.3)

## 2022-01-06 LAB — LIPID PANEL
Cholesterol: 252 mg/dL — ABNORMAL HIGH (ref 0–200)
HDL: 72.1 mg/dL (ref >39.00)
LDL Cholesterol: 160 mg/dL — ABNORMAL HIGH (ref 0–99)
NonHDL: 179.86
Total CHOL/HDL Ratio: 3
Triglycerides: 99 mg/dL (ref 0.0–149.0)
VLDL: 19.8 mg/dL (ref 0.0–40.0)

## 2022-01-06 LAB — CBC
HCT: 41.2 % (ref 36.0–46.0)
Hemoglobin: 13.5 g/dL (ref 12.0–15.0)
MCHC: 32.7 g/dL (ref 30.0–36.0)
MCV: 85.6 fl (ref 78.0–100.0)
Platelets: 272 10*3/uL (ref 150.0–400.0)
RBC: 4.82 Mil/uL (ref 3.87–5.11)
RDW: 15.6 % — ABNORMAL HIGH (ref 11.5–15.5)
WBC: 7 10*3/uL (ref 4.0–10.5)

## 2022-01-06 LAB — HEMOGLOBIN A1C: Hgb A1c MFr Bld: 6.5 % (ref 4.6–6.5)

## 2022-01-06 LAB — MICROALBUMIN / CREATININE URINE RATIO
Creatinine,U: 102.8 mg/dL
Microalb Creat Ratio: 1.2 mg/g (ref 0.0–30.0)
Microalb, Ur: 1.2 mg/dL (ref 0.0–1.9)

## 2022-01-06 LAB — VITAMIN B12: Vitamin B-12: 401 pg/mL (ref 211–911)

## 2022-01-06 NOTE — Assessment & Plan Note (Signed)
Mammogram UTD. Continue screenings.

## 2022-01-06 NOTE — Assessment & Plan Note (Signed)
No apparent fracture. Offered to xray right ankle, she declines.  Continue with ACE bandage, elevation, ice.  Return precautions provided.

## 2022-01-06 NOTE — Assessment & Plan Note (Signed)
Intolerant to multiple statins and Zetia, itching with pravastatin. Repeat lipid panel pending, patient requests.

## 2022-01-08 ENCOUNTER — Other Ambulatory Visit: Payer: Self-pay | Admitting: Primary Care

## 2022-01-08 DIAGNOSIS — E785 Hyperlipidemia, unspecified: Secondary | ICD-10-CM

## 2022-01-08 MED ORDER — EZETIMIBE 10 MG PO TABS: 10.0000 mg | ORAL_TABLET | Freq: Every day | ORAL | 0 refills | Status: DC

## 2022-01-09 ENCOUNTER — Telehealth: Payer: Self-pay

## 2022-01-09 NOTE — Telephone Encounter (Signed)
She can try the pravastatin 40 mg every other day, but she needs to notify me if she develops itching.

## 2022-01-09 NOTE — Telephone Encounter (Signed)
Patient is calling in stating that the new medication that was prescribed was too expensive, wanted to know if she can go back on pravastatin (PRAVACHOL) 40 MG tablet. Wanted to know if she can take it one every other day.

## 2022-01-09 NOTE — Addendum Note (Signed)
Addended by: Pleas Koch on: 01/09/2022 05:50 PM   Modules accepted: Orders

## 2022-01-12 NOTE — Telephone Encounter (Signed)
Called patient reviewed all information and repeated back to me. Will call if any questions.  ? ?

## 2022-03-02 DIAGNOSIS — M353 Polymyalgia rheumatica: Secondary | ICD-10-CM | POA: Diagnosis not present

## 2022-03-02 DIAGNOSIS — M65342 Trigger finger, left ring finger: Secondary | ICD-10-CM | POA: Diagnosis not present

## 2022-03-02 DIAGNOSIS — M159 Polyosteoarthritis, unspecified: Secondary | ICD-10-CM | POA: Diagnosis not present

## 2022-03-02 DIAGNOSIS — M791 Myalgia, unspecified site: Secondary | ICD-10-CM | POA: Diagnosis not present

## 2022-03-03 ENCOUNTER — Other Ambulatory Visit: Payer: Self-pay | Admitting: Primary Care

## 2022-03-03 DIAGNOSIS — E785 Hyperlipidemia, unspecified: Secondary | ICD-10-CM

## 2022-03-09 ENCOUNTER — Telehealth: Payer: Self-pay | Admitting: Primary Care

## 2022-03-09 NOTE — Telephone Encounter (Signed)
Pt called in wants to know can she have her thyroid tested with her lab appointment this week . Please Advise [phone]

## 2022-03-09 NOTE — Telephone Encounter (Signed)
What is the reason she is requesting a thyroid test? Is there an issue or new problem?  I don't see a history of thyroid disorder and her thyroid was normal in 2021.

## 2022-03-10 NOTE — Telephone Encounter (Signed)
Patient notified as instructed by telephone and verbalized understanding. Patient stated that she has been feeling more tired lately and thought that it may have to do with her thyroid. Patient stated that her RA Dr. Posey Pronto started her on Gabapentin and since starting the medication she has felt lightheaded at times. Patient stated that the lightheadedness comes and goes. Patient denies any other symptoms. Patient stated that she will call Dr. Posey Pronto and let him know about the gabapentin. Patient was given ER precautions and she verbalized understanding.

## 2022-03-10 NOTE — Telephone Encounter (Signed)
Noted. Agree with updating Dr. Posey Pronto.

## 2022-03-11 ENCOUNTER — Other Ambulatory Visit (INDEPENDENT_AMBULATORY_CARE_PROVIDER_SITE_OTHER): Payer: Medicare Other

## 2022-03-11 DIAGNOSIS — E785 Hyperlipidemia, unspecified: Secondary | ICD-10-CM

## 2022-03-11 LAB — LIPID PANEL
Cholesterol: 231 mg/dL — ABNORMAL HIGH (ref 0–200)
HDL: 70.7 mg/dL (ref >39.00)
LDL Cholesterol: 139 mg/dL — ABNORMAL HIGH (ref 0–99)
NonHDL: 160.79
Total CHOL/HDL Ratio: 3
Triglycerides: 109 mg/dL (ref 0.0–149.0)
VLDL: 21.8 mg/dL (ref 0.0–40.0)

## 2022-03-12 ENCOUNTER — Other Ambulatory Visit: Payer: Self-pay | Admitting: Primary Care

## 2022-04-01 DIAGNOSIS — H2513 Age-related nuclear cataract, bilateral: Secondary | ICD-10-CM | POA: Diagnosis not present

## 2022-04-01 LAB — HM DIABETES EYE EXAM

## 2022-04-04 ENCOUNTER — Other Ambulatory Visit: Payer: Self-pay | Admitting: Primary Care

## 2022-04-04 DIAGNOSIS — K219 Gastro-esophageal reflux disease without esophagitis: Secondary | ICD-10-CM

## 2022-04-06 ENCOUNTER — Telehealth: Payer: Self-pay | Admitting: Primary Care

## 2022-04-06 NOTE — Telephone Encounter (Signed)
Patient has flu shot scheduled, she was informed to contact pharmacy for shingles vaccine due to insurance.

## 2022-04-06 NOTE — Telephone Encounter (Signed)
For this patient I recommend 1 month in between flu and shingles vaccines

## 2022-04-06 NOTE — Telephone Encounter (Signed)
How far apart do they need to be?

## 2022-04-06 NOTE — Telephone Encounter (Signed)
Patient daughter in law called and scheduled a flu shot and also wanted a shingle shot. Wanted to know when the shingle shot can be given. Call back number [phone].

## 2022-04-07 ENCOUNTER — Ambulatory Visit (INDEPENDENT_AMBULATORY_CARE_PROVIDER_SITE_OTHER): Payer: Medicare Other

## 2022-04-07 DIAGNOSIS — Z23 Encounter for immunization: Secondary | ICD-10-CM

## 2022-05-06 ENCOUNTER — Other Ambulatory Visit: Payer: Self-pay | Admitting: Primary Care

## 2022-05-06 DIAGNOSIS — Z1231 Encounter for screening mammogram for malignant neoplasm of breast: Secondary | ICD-10-CM

## 2022-05-18 ENCOUNTER — Other Ambulatory Visit: Payer: Self-pay | Admitting: Primary Care

## 2022-05-18 DIAGNOSIS — R600 Localized edema: Secondary | ICD-10-CM

## 2022-05-18 DIAGNOSIS — I10 Essential (primary) hypertension: Secondary | ICD-10-CM

## 2022-06-26 ENCOUNTER — Ambulatory Visit
Admission: RE | Admit: 2022-06-26 | Discharge: 2022-06-26 | Disposition: A | Discharge status: Home / Self Care | Payer: Medicare Other | Source: Ambulatory Visit | Attending: Primary Care | Admitting: Primary Care

## 2022-06-26 DIAGNOSIS — Z1231 Encounter for screening mammogram for malignant neoplasm of breast: Secondary | ICD-10-CM | POA: Diagnosis not present

## 2022-06-29 ENCOUNTER — Other Ambulatory Visit: Payer: Self-pay | Admitting: Primary Care

## 2022-06-29 NOTE — Telephone Encounter (Signed)
Please call patient:  From our last discussion I believe that she is taking 1/2 tablet of the pravastatin 80 mg for cholesterol. Is this right? If so, then I'll send in the pravastatin 40 mg dose so that she doesn't have to cut the pills in half.  Let me know, thanks!

## 2022-06-30 NOTE — Telephone Encounter (Signed)
Noted. Refill(s) sent to pharmacy.  

## 2022-06-30 NOTE — Telephone Encounter (Signed)
Patient stated she started back taking one '80mg'$  tablet daily for a couple months now. She states she previously thought her itching was related to this medication but now that she has been back on '80mg'$  daily, she has not had any issue with itching.  She is requesting a refill on the '80mg'$  dose.

## 2022-07-02 DIAGNOSIS — M353 Polymyalgia rheumatica: Secondary | ICD-10-CM | POA: Diagnosis not present

## 2022-07-02 DIAGNOSIS — M159 Polyosteoarthritis, unspecified: Secondary | ICD-10-CM | POA: Diagnosis not present

## 2022-07-02 DIAGNOSIS — M1712 Unilateral primary osteoarthritis, left knee: Secondary | ICD-10-CM | POA: Diagnosis not present

## 2022-07-21 ENCOUNTER — Encounter: Payer: Self-pay | Admitting: Primary Care

## 2022-07-21 ENCOUNTER — Ambulatory Visit (INDEPENDENT_AMBULATORY_CARE_PROVIDER_SITE_OTHER): Payer: Medicare Other | Admitting: Primary Care

## 2022-07-21 VITALS — BP 130/86 | HR 93 | Temp 98.2°F | Ht <= 58 in | Wt 162.0 lb

## 2022-07-21 DIAGNOSIS — E119 Type 2 diabetes mellitus without complications: Secondary | ICD-10-CM | POA: Diagnosis not present

## 2022-07-21 DIAGNOSIS — H9201 Otalgia, right ear: Secondary | ICD-10-CM | POA: Diagnosis not present

## 2022-07-21 LAB — POCT GLYCOSYLATED HEMOGLOBIN (HGB A1C): Hemoglobin A1C: 6.4 % — AB (ref 4.0–5.6)

## 2022-07-21 MED ORDER — FLUTICASONE PROPIONATE 50 MCG/ACT NA SUSP: 1.0000 | Freq: Two times a day (BID) | NASAL | 0 refills | Status: DC

## 2022-07-21 NOTE — Progress Notes (Signed)
Subjective:    Patient ID: Alyssa Roy, female    DOB: 1932/11/18, 89 y.o.   MRN: [id number]  HPI  Alyssa Roy is a very pleasant 89 y.o. female  has a past medical history of Atherosclerosis, Chronic venous stasis, Diabetes mellitus without complication ([place]), GAD (generalized anxiety disorder), Hyperglycemia, Hyperlipidemia, Hypertension, Insomnia, Osteoarthritis, and Urinary tract infection. who presents today for follow up of diabetes and to discuss ear pain.  1) Type 2 Diabetes:   Current medications include: None  She is checking her blood glucose _ times daily and is getting readings of   Last A1C: 6.5 in June 2023, 6.4 today Last Eye Exam: Completed at [facility] Exam: UTD Pneumonia Vaccination: 2019 Urine Microalbumin: June 2023 Statin: pravastatin   Dietary changes since last visit: None. Is mostly eating home cooked meals. Avoiding fried foods. Drinking water daily.   Exercise: None   2) Otalgia: Acute onset with intermittent, right ear pain for the last 2-3 weeks. She describes her pain as "nagging and achy". She does wear bilateral hearing aids.   She's also developed right sided neck pain. Evaluated by her rheumatologist in late December 2023 who suspected neck pain was secondary to arthritis.   She's tried applying warm olive oil and OTC alcohol with some improvement.    Review of Systems  HENT:  Positive for ear pain.   Respiratory:  Negative for shortness of breath.   Cardiovascular:  Negative for chest pain.  Endocrine: Negative for polydipsia, polyphagia and polyuria.  Neurological:  Negative for numbness.         Past Medical History:  Diagnosis Date   Atherosclerosis    Chronic venous stasis    Diabetes mellitus without complication (HCC)    GAD (generalized anxiety disorder)    Hyperglycemia    Hyperlipidemia    Hypertension    Insomnia    Osteoarthritis    Urinary tract infection     Social History    Socioeconomic History   Marital status: Unknown    Spouse name: Not on file   Number of children: Not on file   Years of education: Not on file   Highest education level: Not on file  Occupational History   Not on file  Tobacco Use   Smoking status: Never   Smokeless tobacco: Never  Vaping Use   Vaping Use: Never used  Substance and Sexual Activity   Alcohol use: Not Currently   Drug use: Not Currently   Sexual activity: Not Currently  Other Topics Concern   Not on file  Social History Narrative   Widow.   Retired.   Moved to [place] to live closer to daughter.   Social Determinants of Health   Financial Resource Strain: Low Risk  (12/05/2021)   Overall Financial Resource Strain (CARDIA)    Difficulty of Paying Living Expenses: Not hard at all  Food Insecurity: No Food Insecurity (12/05/2021)   Hunger Vital Sign    Worried About Running Out of Food in the Last Year: Never true    Ran Out of Food in the Last Year: Never true  Transportation Needs: No Transportation Needs (12/05/2021)   PRAPARE - Hydrologist (Medical): No    Lack of Transportation (Non-Medical): No  Physical Activity: Inactive (12/05/2021)   Exercise Vital Sign    Days of Exercise per Week: 0 days    Minutes of Exercise per Session: 0 min  Stress: No Stress Concern  Present (12/05/2021)   [place]    Feeling of Stress : Not at all  Social Connections: Not on file  Intimate Partner Violence: Not At Risk (12/04/2020)   Humiliation, Afraid, Rape, and Kick questionnaire    Fear of Current or Ex-Partner: No    Emotionally Abused: No    Physically Abused: No    Sexually Abused: No    Past Surgical History:  Procedure Laterality Date   ABDOMINAL HYSTERECTOMY  1979   BREAST BIOPSY Right    neg early 2000's   CHOLECYSTECTOMY  1984   REPLACEMENT TOTAL KNEE Right 2007    Family History  Problem Relation  Age of Onset   Heart disease Mother    Arthritis Mother    Diabetes Father    Breast cancer Daughter 33    Allergies  Allergen Reactions   Crestor [Rosuvastatin Calcium]    Feldene [Piroxicam]    Keflex [Cephalexin]    Lipitor [Atorvastatin Calcium]    Zetia [Ezetimibe]    Aspirin Other (See Comments)    Stomach discomfort    Current Outpatient Medications on File Prior to Visit  Medication Sig Dispense Refill   acetaminophen (TYLENOL) 650 MG CR tablet Take 650 mg by mouth every 8 (eight) hours as needed for pain.     co-enzyme Q-10 30 MG capsule Take 30 mg by mouth daily.     Coenzyme Q10 (COQ10) 100 MG CAPS Take by mouth daily.      diclofenac Sodium (VOLTAREN) 1 % GEL Apply 2 g topically 3 (three) times daily as needed. 100 g 1   famotidine (PEPCID) 20 MG tablet Take 1 tablet (20 mg total) by mouth daily. For heartburn. 90 tablet 2   hydrochlorothiazide (HYDRODIURIL) 12.5 MG tablet TAKE 1 TABLET (12.5 MG TOTAL) BY MOUTH DAILY. FOR BLOOD PRESSURE AND SWELLING. 90 tablet 1   pravastatin (PRAVACHOL) 80 MG tablet TAKE 1 TABLET (80 MG TOTAL) BY MOUTH EVERY EVENING FOR CHOLESTEROL. 90 tablet 1   Cholecalciferol (D-3-5) 125 MCG (5000 UT) capsule Take 5,000 Units by mouth daily.     gabapentin (NEURONTIN) 100 MG capsule Take 100 mg by mouth 2 (two) times daily. (Patient not taking: Reported on 07/21/2022)     No current facility-administered medications on file prior to visit.    BP 130/86   Pulse 93   Temp 98.2 F (36.8 C) (Temporal)   Ht '4\' 7"'$  (1.397 m)   Wt 162 lb (73.5 kg)   SpO2 98%   BMI 37.65 kg/m  Objective:   Physical Exam HENT:     Right Ear: Tympanic membrane and ear canal normal.     Left Ear: Tympanic membrane and ear canal normal.  Cardiovascular:     Rate and Rhythm: Normal rate and regular rhythm.  Pulmonary:     Effort: Pulmonary effort is normal.     Breath sounds: Normal breath sounds.  Musculoskeletal:     Cervical back: Neck supple.  Skin:     General: Skin is warm and dry.           Assessment & Plan:  Diet-controlled type 2 diabetes mellitus ([place]) Assessment & Plan: Well controlled with A1C reading today is 6.4. Continue off of medications.   Eye exam- up to date. Pneumonia vaccine- up to date. Managed on pravastatin.  Urine ACR and foot exam up to date.   Follow up in 6 months for physical.   I evaluated patient, was  consulted regarding treatment, and agree with assessment and plan per Tinnie Gens, RN, DNP student.   Allie Bossier, NP-C   Orders: -     POCT glycosylated hemoglobin (Hb A1C)  Otalgia of right ear Assessment & Plan: No alarm signs on exam.  Exam consistent for non-infectious small effusion.   Will send in prescription for Flonase 1 spray BID.  We have asked her to update if no improvement.   I evaluated patient, was consulted regarding treatment, and agree with assessment and plan per Tinnie Gens, RN, DNP student.   Allie Bossier, NP-C    Orders: -     Fluticasone Propionate; Place 1 spray into both nostrils 2 (two) times daily.  Dispense: 16 g; Refill: 0        Pleas Koch, NP

## 2022-07-21 NOTE — Patient Instructions (Addendum)
It is important that you improve your diet. Please limit carbohydrates in the form of white bread, rice, pasta, sweets, fast food, fried food, sugary drinks, etc. Increase your consumption of fresh fruits and vegetables, whole grains, lean protein.  Ensure you are consuming 64 ounces of water daily.  Start Flonase one spray twice a day. Prescription has been sent to your pharmacy.   We will see you back in six months for a follow up.

## 2022-07-21 NOTE — Assessment & Plan Note (Addendum)
Well controlled with A1C reading today is 6.4. Continue off of medications.   Eye exam- up to date. Pneumonia vaccine- up to date. Managed on pravastatin.  Urine ACR and foot exam up to date.   Follow up in 6 months for physical.   I evaluated patient, was consulted regarding treatment, and agree with assessment and plan per Tinnie Gens, RN, DNP student.   Allie Bossier, NP-C

## 2022-07-21 NOTE — Progress Notes (Signed)
Established Patient Office Visit  Subjective   Patient ID: Alyssa Roy, female    DOB: 1932-08-05  Age: 89 y.o. MRN: [id number]  Chief Complaint  Patient presents with   Medical Management of Chronic Issues    diabetes   Ear Pain    Right ear pain on and off for a few weeks     HPI  Alyssa Roy is a 89 year old with past medical history of type 2 diabetes, hyperlipidemia, GERD, GAD presents today to discuss diabetes management.   Current medications include: none. Diet controlled.  She does not check her blood glucose at home.   Last A1C: 6.5 in June.  Last Eye Exam: Completed at [facility].  Last Foot Exam: Up to date Pneumonia Vaccination: 2019 Urine Microalbumin: up to date Statin: Pravastatin  Dietary changes since last visit: eats a lot of home cooked meals and lean cuisine's sometimes. Avoids fried foods. Eats a lot of baked foods. She drinks a lot of water.   Exercise: none  Right ear pain: symptoms started 2 weeks ago. Denies any ear drainage, trauma or injury. Wears hearing aids. She did try some OTC alcohol q tip for her ear with minimal relief. She has noticed that the pain does radiate to right side of her neck along with headache. She denies any congestion or recent respiratory infection.     Review of Systems  HENT:  Positive for ear pain. Negative for congestion, hearing loss and sinus pain.   Respiratory:  Negative for shortness of breath.   Cardiovascular:  Negative for chest pain.  Genitourinary:  Negative for dysuria and frequency.  Neurological:  Positive for headaches. Negative for tingling.  Endo/Heme/Allergies:  Negative for polydipsia.      Objective:     BP 130/86   Pulse 93   Temp 98.2 F (36.8 C) (Temporal)   Ht '4\' 7"'$  (1.397 m)   Wt 162 lb (73.5 kg)   SpO2 98%   BMI 37.65 kg/m  BP Readings from Last 3 Encounters:  07/21/22 130/86  01/06/22 134/78  12/18/20 126/80   Wt Readings from Last 3 Encounters:   07/21/22 162 lb (73.5 kg)  01/06/22 158 lb (71.7 kg)  12/05/21 149 lb (67.6 kg)      Physical Exam Vitals and nursing note reviewed.  Constitutional:      Appearance: Normal appearance.  HENT:     Right Ear: Ear canal and external ear normal. A middle ear effusion is present. There is no impacted cerumen.     Left Ear: Tympanic membrane, ear canal and external ear normal. There is no impacted cerumen.  Cardiovascular:     Rate and Rhythm: Normal rate and regular rhythm.     Pulses: Normal pulses.     Heart sounds: Normal heart sounds.  Pulmonary:     Effort: Pulmonary effort is normal.     Breath sounds: Normal breath sounds.  Neurological:     Mental Status: She is alert and oriented to person, place, and time.  Psychiatric:        Mood and Affect: Mood normal.        Behavior: Behavior normal.      Results for orders placed or performed in visit on 07/21/22  POCT glycosylated hemoglobin (Hb A1C)  Result Value Ref Range   Hemoglobin A1C 6.4 (A) 4.0 - 5.6 %   HbA1c POC (<> result, manual entry)     HbA1c, POC (prediabetic range)  HbA1c, POC (controlled diabetic range)         The ASCVD Risk score (Arnett DK, et al., 2019) failed to calculate for the following reasons:   The 2019 ASCVD risk score is only valid for ages 66 to 64    Assessment & Plan:   Problem List Items Addressed This Visit       Endocrine   Diet-controlled type 2 diabetes mellitus ([place]) - Primary    Hemoglobin A1C reading today is 6.4. Continue off of medications.   Eye exam- up to date. Pneumonia vaccine- up to date. Managed on pravastatin.  Urine ACR and foot exam up to date.   Follow up in 6 months for physical.       Relevant Orders   POCT glycosylated hemoglobin (Hb A1C) (Completed)     Other   Otalgia of right ear    No alarm signs on exam. Tympanic membrane intact. Landmarks visible. No impaction. Some fluid noticed behind right ear.   Will send in prescription for  Flonase 1 spray BID.         Relevant Medications   fluticasone (FLONASE) 50 MCG/ACT nasal spray    Return in about 6 months (around 01/19/2023) for diabetes and cholesterol.Tinnie Gens, BSN-RN, DNP STUDENT

## 2022-07-21 NOTE — Assessment & Plan Note (Addendum)
No alarm signs on exam.  Exam consistent for non-infectious small effusion.   Will send in prescription for Flonase 1 spray BID.  We have asked her to update if no improvement.   I evaluated patient, was consulted regarding treatment, and agree with assessment and plan per Tinnie Gens, RN, DNP student.   Allie Bossier, NP-C

## 2022-07-22 ENCOUNTER — Encounter: Payer: Self-pay | Admitting: Primary Care

## 2022-08-12 ENCOUNTER — Telehealth: Payer: Self-pay | Admitting: Primary Care

## 2022-08-12 ENCOUNTER — Other Ambulatory Visit: Payer: Self-pay | Admitting: Primary Care

## 2022-08-12 DIAGNOSIS — H9201 Otalgia, right ear: Secondary | ICD-10-CM

## 2022-08-12 MED ORDER — FLUTICASONE PROPIONATE 50 MCG/ACT NA SUSP: 1.0000 | Freq: Two times a day (BID) | NASAL | 0 refills | Status: DC

## 2022-08-12 NOTE — Telephone Encounter (Signed)
Prescription Request  08/12/2022  Is this a "Controlled Substance" medicine? No  LOV: 07/21/2022  What is the name of the medication or equipment fluticasone (FLONASE) 50 MCG/ACT nasal spray ?   Have you contacted your pharmacy to request a refill? Yes   Which pharmacy would you like this sent to?  [name]Phone: [phone]: [phone]   Patient notified that their request is being sent to the clinical staff for review and that they should receive a response within 2 business days.   Please advise at Mobile [phone](mobile)

## 2022-08-12 NOTE — Telephone Encounter (Signed)
Requested Prescriptions   Signed Prescriptions Disp Refills   fluticasone (FLONASE) 50 MCG/ACT nasal spray 16 g 0    Sig: Place 1 spray into both nostrils 2 (two) times daily.    Authorizing Provider: Pleas Koch   Refill(s) sent to pharmacy.

## 2022-08-13 NOTE — Telephone Encounter (Signed)
Error

## 2022-08-17 ENCOUNTER — Telehealth: Payer: Self-pay | Admitting: Primary Care

## 2022-08-17 NOTE — Telephone Encounter (Signed)
Symptoms typically last only a few days, so it's possible that she had something else going on. Glad she's feeling better. I'm happy to see her if she does not recover.   No need for additional shingles vaccines.

## 2022-08-17 NOTE — Telephone Encounter (Signed)
Called patient and reviewed all information. Patient verbalized understanding. Will call if any further questions.  

## 2022-08-17 NOTE — Telephone Encounter (Signed)
Patient states she got the 2nd shingles shot on [facility]/22/22 at [facility].   She states a couple days after that she has lost her appetite,nauseous/upset stomach w/ no vomiting, chills, left foot and leg was swollen.  She states she has just in the last day or so started to feel better and wanting to eat again. She states she is just having some chills now.  She would like to know how long it takes for the symptoms from the shot to go away? Does she need to get another one because she waited so long between the first and second?   She declined an in office or virtual appt for her symptoms.

## 2022-08-17 NOTE — Telephone Encounter (Signed)
Patient called in and stated that she had her shingles shot two weeks ago. She had some questions regarding the shot. Please advise. Thank you!

## 2022-08-20 DIAGNOSIS — M064 Inflammatory polyarthropathy: Secondary | ICD-10-CM | POA: Diagnosis not present

## 2022-09-02 DIAGNOSIS — R7982 Elevated C-reactive protein (CRP): Secondary | ICD-10-CM | POA: Diagnosis not present

## 2022-09-11 ENCOUNTER — Other Ambulatory Visit: Payer: Self-pay | Admitting: Primary Care

## 2022-09-11 DIAGNOSIS — H9201 Otalgia, right ear: Secondary | ICD-10-CM

## 2022-10-01 DIAGNOSIS — M353 Polymyalgia rheumatica: Secondary | ICD-10-CM | POA: Diagnosis not present

## 2022-10-01 DIAGNOSIS — M159 Polyosteoarthritis, unspecified: Secondary | ICD-10-CM | POA: Diagnosis not present

## 2022-10-01 DIAGNOSIS — R768 Other specified abnormal immunological findings in serum: Secondary | ICD-10-CM | POA: Diagnosis not present

## 2022-11-18 ENCOUNTER — Other Ambulatory Visit: Payer: Self-pay | Admitting: Primary Care

## 2022-11-18 DIAGNOSIS — R600 Localized edema: Secondary | ICD-10-CM

## 2022-11-18 DIAGNOSIS — I10 Essential (primary) hypertension: Secondary | ICD-10-CM

## 2022-12-09 DIAGNOSIS — M65342 Trigger finger, left ring finger: Secondary | ICD-10-CM | POA: Diagnosis not present

## 2022-12-23 ENCOUNTER — Other Ambulatory Visit: Payer: Self-pay | Admitting: Primary Care

## 2022-12-23 DIAGNOSIS — E785 Hyperlipidemia, unspecified: Secondary | ICD-10-CM

## 2022-12-23 NOTE — Telephone Encounter (Signed)
Spoke with patient and she stated that she will be going to [facility] because its closer to her home. She also stated that she hasn't been taking this medication.

## 2022-12-23 NOTE — Telephone Encounter (Signed)
Noted. Will remove myself as PCP. 

## 2022-12-23 NOTE — Telephone Encounter (Signed)
Patient is due for follow up, this will be required prior to any further refills.  Please schedule, thank you!    

## 2023-01-20 ENCOUNTER — Ambulatory Visit: Payer: Medicare Other | Admitting: Primary Care

## 2023-01-27 DIAGNOSIS — R739 Hyperglycemia, unspecified: Secondary | ICD-10-CM | POA: Diagnosis not present

## 2023-01-27 DIAGNOSIS — Z0000 Encounter for general adult medical examination without abnormal findings: Secondary | ICD-10-CM | POA: Diagnosis not present

## 2023-01-27 DIAGNOSIS — F411 Generalized anxiety disorder: Secondary | ICD-10-CM | POA: Diagnosis not present

## 2023-01-27 DIAGNOSIS — G4733 Obstructive sleep apnea (adult) (pediatric): Secondary | ICD-10-CM | POA: Diagnosis not present

## 2023-01-27 DIAGNOSIS — Z79899 Other long term (current) drug therapy: Secondary | ICD-10-CM | POA: Diagnosis not present

## 2023-01-27 DIAGNOSIS — M353 Polymyalgia rheumatica: Secondary | ICD-10-CM | POA: Diagnosis not present

## 2023-01-27 DIAGNOSIS — M159 Polyosteoarthritis, unspecified: Secondary | ICD-10-CM | POA: Diagnosis not present

## 2023-02-03 DIAGNOSIS — M159 Polyosteoarthritis, unspecified: Secondary | ICD-10-CM | POA: Diagnosis not present

## 2023-02-03 DIAGNOSIS — R202 Paresthesia of skin: Secondary | ICD-10-CM | POA: Diagnosis not present

## 2023-02-03 DIAGNOSIS — R768 Other specified abnormal immunological findings in serum: Secondary | ICD-10-CM | POA: Diagnosis not present

## 2023-02-03 DIAGNOSIS — M353 Polymyalgia rheumatica: Secondary | ICD-10-CM | POA: Diagnosis not present

## 2023-02-12 ENCOUNTER — Other Ambulatory Visit: Payer: Self-pay | Admitting: Primary Care

## 2023-02-12 DIAGNOSIS — I10 Essential (primary) hypertension: Secondary | ICD-10-CM

## 2023-02-12 DIAGNOSIS — R600 Localized edema: Secondary | ICD-10-CM

## 2023-03-23 ENCOUNTER — Other Ambulatory Visit: Payer: Self-pay | Admitting: Internal Medicine

## 2023-03-23 DIAGNOSIS — Z1231 Encounter for screening mammogram for malignant neoplasm of breast: Secondary | ICD-10-CM

## 2023-04-07 DIAGNOSIS — H43813 Vitreous degeneration, bilateral: Secondary | ICD-10-CM | POA: Diagnosis not present

## 2023-04-07 DIAGNOSIS — H532 Diplopia: Secondary | ICD-10-CM | POA: Diagnosis not present

## 2023-04-07 DIAGNOSIS — E119 Type 2 diabetes mellitus without complications: Secondary | ICD-10-CM | POA: Diagnosis not present

## 2023-04-07 DIAGNOSIS — H2513 Age-related nuclear cataract, bilateral: Secondary | ICD-10-CM | POA: Diagnosis not present

## 2023-04-23 DIAGNOSIS — F411 Generalized anxiety disorder: Secondary | ICD-10-CM | POA: Diagnosis not present

## 2023-04-23 DIAGNOSIS — Z23 Encounter for immunization: Secondary | ICD-10-CM | POA: Diagnosis not present

## 2023-04-23 DIAGNOSIS — R42 Dizziness and giddiness: Secondary | ICD-10-CM | POA: Diagnosis not present

## 2023-04-23 DIAGNOSIS — I878 Other specified disorders of veins: Secondary | ICD-10-CM | POA: Diagnosis not present

## 2023-04-23 DIAGNOSIS — M353 Polymyalgia rheumatica: Secondary | ICD-10-CM | POA: Diagnosis not present

## 2023-06-04 DIAGNOSIS — F411 Generalized anxiety disorder: Secondary | ICD-10-CM | POA: Diagnosis not present

## 2023-06-04 DIAGNOSIS — R252 Cramp and spasm: Secondary | ICD-10-CM | POA: Diagnosis not present

## 2023-06-04 DIAGNOSIS — M353 Polymyalgia rheumatica: Secondary | ICD-10-CM | POA: Diagnosis not present

## 2023-06-04 DIAGNOSIS — E119 Type 2 diabetes mellitus without complications: Secondary | ICD-10-CM | POA: Diagnosis not present

## 2023-06-07 DIAGNOSIS — M791 Myalgia, unspecified site: Secondary | ICD-10-CM | POA: Diagnosis not present

## 2023-06-07 DIAGNOSIS — M65331 Trigger finger, right middle finger: Secondary | ICD-10-CM | POA: Diagnosis not present

## 2023-06-07 DIAGNOSIS — G8929 Other chronic pain: Secondary | ICD-10-CM | POA: Diagnosis not present

## 2023-06-07 DIAGNOSIS — M1712 Unilateral primary osteoarthritis, left knee: Secondary | ICD-10-CM | POA: Diagnosis not present

## 2023-06-07 DIAGNOSIS — M353 Polymyalgia rheumatica: Secondary | ICD-10-CM | POA: Diagnosis not present

## 2023-06-07 DIAGNOSIS — M150 Primary generalized (osteo)arthritis: Secondary | ICD-10-CM | POA: Diagnosis not present

## 2023-06-07 DIAGNOSIS — M25562 Pain in left knee: Secondary | ICD-10-CM | POA: Diagnosis not present

## 2023-06-28 ENCOUNTER — Ambulatory Visit
Admission: RE | Admit: 2023-06-28 | Discharge: 2023-06-28 | Disposition: A | Discharge status: Home / Self Care | Payer: Medicare Other | Source: Ambulatory Visit | Attending: Internal Medicine | Admitting: Internal Medicine

## 2023-06-28 DIAGNOSIS — Z1231 Encounter for screening mammogram for malignant neoplasm of breast: Secondary | ICD-10-CM | POA: Insufficient documentation

## 2023-08-06 DIAGNOSIS — E538 Deficiency of other specified B group vitamins: Secondary | ICD-10-CM | POA: Diagnosis not present

## 2023-08-06 DIAGNOSIS — Z6836 Body mass index (BMI) 36.0-36.9, adult: Secondary | ICD-10-CM | POA: Diagnosis not present

## 2023-10-05 DIAGNOSIS — M150 Primary generalized (osteo)arthritis: Secondary | ICD-10-CM | POA: Diagnosis not present

## 2023-10-05 DIAGNOSIS — M65342 Trigger finger, left ring finger: Secondary | ICD-10-CM | POA: Diagnosis not present

## 2023-10-05 DIAGNOSIS — R768 Other specified abnormal immunological findings in serum: Secondary | ICD-10-CM | POA: Diagnosis not present

## 2023-10-05 DIAGNOSIS — M353 Polymyalgia rheumatica: Secondary | ICD-10-CM | POA: Diagnosis not present

## 2023-10-05 DIAGNOSIS — M791 Myalgia, unspecified site: Secondary | ICD-10-CM | POA: Diagnosis not present

## 2024-02-08 DIAGNOSIS — M150 Primary generalized (osteo)arthritis: Secondary | ICD-10-CM | POA: Diagnosis not present

## 2024-02-08 DIAGNOSIS — M353 Polymyalgia rheumatica: Secondary | ICD-10-CM | POA: Diagnosis not present

## 2024-02-08 DIAGNOSIS — M791 Myalgia, unspecified site: Secondary | ICD-10-CM | POA: Diagnosis not present

## 2024-02-08 DIAGNOSIS — E119 Type 2 diabetes mellitus without complications: Secondary | ICD-10-CM | POA: Diagnosis not present

## 2024-02-08 DIAGNOSIS — E538 Deficiency of other specified B group vitamins: Secondary | ICD-10-CM | POA: Diagnosis not present

## 2024-02-15 DIAGNOSIS — M353 Polymyalgia rheumatica: Secondary | ICD-10-CM | POA: Diagnosis not present

## 2024-02-15 DIAGNOSIS — Z1331 Encounter for screening for depression: Secondary | ICD-10-CM | POA: Diagnosis not present

## 2024-02-15 DIAGNOSIS — I129 Hypertensive chronic kidney disease with stage 1 through stage 4 chronic kidney disease, or unspecified chronic kidney disease: Secondary | ICD-10-CM | POA: Diagnosis not present

## 2024-02-15 DIAGNOSIS — Z0000 Encounter for general adult medical examination without abnormal findings: Secondary | ICD-10-CM | POA: Diagnosis not present

## 2024-02-15 DIAGNOSIS — M0589 Other rheumatoid arthritis with rheumatoid factor of multiple sites: Secondary | ICD-10-CM | POA: Diagnosis not present

## 2024-02-15 DIAGNOSIS — N1831 Chronic kidney disease, stage 3a: Secondary | ICD-10-CM | POA: Diagnosis not present

## 2024-02-15 DIAGNOSIS — M159 Polyosteoarthritis, unspecified: Secondary | ICD-10-CM | POA: Diagnosis not present

## 2024-02-15 DIAGNOSIS — G4733 Obstructive sleep apnea (adult) (pediatric): Secondary | ICD-10-CM | POA: Diagnosis not present

## 2024-03-20 ENCOUNTER — Other Ambulatory Visit: Payer: Self-pay | Admitting: Internal Medicine

## 2024-03-20 DIAGNOSIS — Z1231 Encounter for screening mammogram for malignant neoplasm of breast: Secondary | ICD-10-CM

## 2024-04-12 DIAGNOSIS — E119 Type 2 diabetes mellitus without complications: Secondary | ICD-10-CM | POA: Diagnosis not present

## 2024-04-12 DIAGNOSIS — H2513 Age-related nuclear cataract, bilateral: Secondary | ICD-10-CM | POA: Diagnosis not present

## 2024-04-12 DIAGNOSIS — H43813 Vitreous degeneration, bilateral: Secondary | ICD-10-CM | POA: Diagnosis not present

## 2024-04-12 DIAGNOSIS — H532 Diplopia: Secondary | ICD-10-CM | POA: Diagnosis not present

## 2024-04-19 DIAGNOSIS — Z23 Encounter for immunization: Secondary | ICD-10-CM | POA: Diagnosis not present

## 2024-04-19 DIAGNOSIS — M65321 Trigger finger, right index finger: Secondary | ICD-10-CM | POA: Diagnosis not present

## 2024-04-19 DIAGNOSIS — M65331 Trigger finger, right middle finger: Secondary | ICD-10-CM | POA: Diagnosis not present

## 2024-05-29 DIAGNOSIS — N1831 Chronic kidney disease, stage 3a: Secondary | ICD-10-CM | POA: Diagnosis not present

## 2024-05-29 DIAGNOSIS — I129 Hypertensive chronic kidney disease with stage 1 through stage 4 chronic kidney disease, or unspecified chronic kidney disease: Secondary | ICD-10-CM | POA: Diagnosis not present

## 2024-05-29 DIAGNOSIS — E1142 Type 2 diabetes mellitus with diabetic polyneuropathy: Secondary | ICD-10-CM | POA: Diagnosis not present

## 2024-05-29 DIAGNOSIS — M353 Polymyalgia rheumatica: Secondary | ICD-10-CM | POA: Diagnosis not present

## 2024-05-29 DIAGNOSIS — E1122 Type 2 diabetes mellitus with diabetic chronic kidney disease: Secondary | ICD-10-CM | POA: Diagnosis not present

## 2024-06-12 DIAGNOSIS — M353 Polymyalgia rheumatica: Secondary | ICD-10-CM | POA: Diagnosis not present

## 2024-06-12 DIAGNOSIS — M791 Myalgia, unspecified site: Secondary | ICD-10-CM | POA: Diagnosis not present

## 2024-06-12 DIAGNOSIS — R7689 Other specified abnormal immunological findings in serum: Secondary | ICD-10-CM | POA: Diagnosis not present

## 2024-06-12 DIAGNOSIS — M150 Primary generalized (osteo)arthritis: Secondary | ICD-10-CM | POA: Diagnosis not present

## 2024-06-28 ENCOUNTER — Encounter
